# Patient Record
Sex: Female | Born: 1975 | Race: White | Hispanic: No | Marital: Married | Smoking: Never smoker
Health system: Southern US, Community
[De-identification: ages and names within clinical notes are randomized; demographics above are authoritative.]

## PROBLEM LIST (undated history)

## (undated) DIAGNOSIS — L438 Other lichen planus: Secondary | ICD-10-CM

## (undated) DIAGNOSIS — M1990 Unspecified osteoarthritis, unspecified site: Secondary | ICD-10-CM

## (undated) DIAGNOSIS — L439 Lichen planus, unspecified: Secondary | ICD-10-CM

## (undated) DIAGNOSIS — M1711 Unilateral primary osteoarthritis, right knee: Secondary | ICD-10-CM

## (undated) DIAGNOSIS — K649 Unspecified hemorrhoids: Secondary | ICD-10-CM

## (undated) DIAGNOSIS — I10 Essential (primary) hypertension: Secondary | ICD-10-CM

## (undated) HISTORY — PX: OTHER SURGICAL HISTORY: SHX169

## (undated) HISTORY — PX: HAMMER TOE SURGERY: SHX385

## (undated) HISTORY — PX: BUNIONECTOMY: SHX129

## (undated) HISTORY — DX: Unspecified hemorrhoids: K64.9

## (undated) HISTORY — DX: Lichen planus, unspecified: L43.9

## (undated) HISTORY — DX: Unilateral primary osteoarthritis, right knee: M17.11

## (undated) HISTORY — PX: SCHLEROTHERAPY: SHX5440

## (undated) HISTORY — DX: Essential (primary) hypertension: I10

## (undated) HISTORY — DX: Other lichen planus: L43.8

## (undated) HISTORY — PX: MOUTH SURGERY: SHX715

## (undated) HISTORY — DX: Unspecified osteoarthritis, unspecified site: M19.90

## (undated) HISTORY — PX: FRACTURE SURGERY: SHX138

---

## 2004-08-25 ENCOUNTER — Emergency Department (HOSPITAL_COMMUNITY): Admission: EM | Admit: 2004-08-25 | Discharge: 2004-08-25 | Payer: Self-pay | Admitting: Emergency Medicine

## 2004-09-11 ENCOUNTER — Other Ambulatory Visit: Admission: RE | Admit: 2004-09-11 | Discharge: 2004-09-11 | Payer: Self-pay | Admitting: Family Medicine

## 2005-12-17 ENCOUNTER — Other Ambulatory Visit: Admission: RE | Admit: 2005-12-17 | Discharge: 2005-12-17 | Payer: Self-pay | Admitting: Family Medicine

## 2007-02-28 ENCOUNTER — Other Ambulatory Visit: Admission: RE | Admit: 2007-02-28 | Discharge: 2007-02-28 | Payer: Self-pay | Admitting: Family Medicine

## 2008-06-21 ENCOUNTER — Ambulatory Visit: Payer: Self-pay | Admitting: Pulmonary Disease

## 2008-06-21 DIAGNOSIS — G4720 Circadian rhythm sleep disorder, unspecified type: Secondary | ICD-10-CM | POA: Insufficient documentation

## 2008-07-25 ENCOUNTER — Ambulatory Visit (HOSPITAL_BASED_OUTPATIENT_CLINIC_OR_DEPARTMENT_OTHER): Admission: RE | Admit: 2008-07-25 | Discharge: 2008-07-25 | Payer: Self-pay | Admitting: Pulmonary Disease

## 2008-07-25 ENCOUNTER — Encounter: Payer: Self-pay | Admitting: Pulmonary Disease

## 2008-07-27 ENCOUNTER — Ambulatory Visit: Payer: Self-pay | Admitting: Pulmonary Disease

## 2008-12-31 ENCOUNTER — Ambulatory Visit (HOSPITAL_COMMUNITY): Admission: RE | Admit: 2008-12-31 | Discharge: 2008-12-31 | Payer: Self-pay | Admitting: Obstetrics and Gynecology

## 2009-01-31 ENCOUNTER — Ambulatory Visit (HOSPITAL_COMMUNITY): Admission: RE | Admit: 2009-01-31 | Discharge: 2009-01-31 | Payer: Self-pay | Admitting: Obstetrics and Gynecology

## 2009-02-04 ENCOUNTER — Inpatient Hospital Stay (HOSPITAL_COMMUNITY): Admission: AD | Admit: 2009-02-04 | Discharge: 2009-02-12 | Payer: Self-pay | Admitting: Obstetrics and Gynecology

## 2009-02-04 ENCOUNTER — Encounter: Payer: Self-pay | Admitting: Obstetrics and Gynecology

## 2009-02-10 ENCOUNTER — Ambulatory Visit (HOSPITAL_COMMUNITY): Admission: RE | Admit: 2009-02-10 | Discharge: 2009-02-10 | Payer: Self-pay | Admitting: Obstetrics and Gynecology

## 2009-02-28 ENCOUNTER — Ambulatory Visit (HOSPITAL_COMMUNITY): Admission: RE | Admit: 2009-02-28 | Discharge: 2009-02-28 | Payer: Self-pay | Admitting: Obstetrics and Gynecology

## 2009-03-21 ENCOUNTER — Ambulatory Visit (HOSPITAL_COMMUNITY): Admission: RE | Admit: 2009-03-21 | Discharge: 2009-03-21 | Payer: Self-pay | Admitting: Obstetrics and Gynecology

## 2009-04-12 ENCOUNTER — Ambulatory Visit (HOSPITAL_COMMUNITY): Admission: RE | Admit: 2009-04-12 | Discharge: 2009-04-12 | Payer: Self-pay | Admitting: Obstetrics and Gynecology

## 2009-04-30 ENCOUNTER — Inpatient Hospital Stay (HOSPITAL_COMMUNITY): Admission: AD | Admit: 2009-04-30 | Discharge: 2009-05-02 | Payer: Self-pay | Admitting: Obstetrics and Gynecology

## 2009-05-09 ENCOUNTER — Ambulatory Visit: Admission: RE | Admit: 2009-05-09 | Discharge: 2009-05-09 | Payer: Self-pay | Admitting: Obstetrics and Gynecology

## 2010-08-28 ENCOUNTER — Encounter (HOSPITAL_COMMUNITY): Payer: Self-pay | Admitting: Obstetrics and Gynecology

## 2010-11-10 LAB — CBC
HCT: 25.2 % — ABNORMAL LOW (ref 36.0–46.0)
Hemoglobin: 11.1 g/dL — ABNORMAL LOW (ref 12.0–15.0)
MCV: 93.2 fL (ref 78.0–100.0)
RBC: 2.7 MIL/uL — ABNORMAL LOW (ref 3.87–5.11)
RBC: 3.55 MIL/uL — ABNORMAL LOW (ref 3.87–5.11)
RDW: 14.2 % (ref 11.5–15.5)
WBC: 13.2 10*3/uL — ABNORMAL HIGH (ref 4.0–10.5)

## 2010-11-10 LAB — CCBB MATERNAL DONOR DRAW

## 2010-11-10 LAB — RPR: RPR Ser Ql: NONREACTIVE

## 2010-11-12 LAB — CBC
HCT: 30.3 % — ABNORMAL LOW (ref 36.0–46.0)
HCT: 31.1 % — ABNORMAL LOW (ref 36.0–46.0)
Hemoglobin: 10.6 g/dL — ABNORMAL LOW (ref 12.0–15.0)
Hemoglobin: 11 g/dL — ABNORMAL LOW (ref 12.0–15.0)
RBC: 3.19 MIL/uL — ABNORMAL LOW (ref 3.87–5.11)
RBC: 3.31 MIL/uL — ABNORMAL LOW (ref 3.87–5.11)
RDW: 12.5 % (ref 11.5–15.5)
RDW: 12.6 % (ref 11.5–15.5)

## 2010-11-12 LAB — TYPE AND SCREEN
ABO/RH(D): O POS
Antibody Screen: NEGATIVE
Antibody Screen: NEGATIVE
Antibody Screen: NEGATIVE

## 2010-11-12 LAB — KLEIHAUER-BETKE STAIN
# Vials RhIg: 1
Quantitation Fetal Hemoglobin: 10 mL

## 2010-11-12 LAB — ABO/RH: ABO/RH(D): O POS

## 2010-12-19 NOTE — Procedures (Signed)
[name], [name]             ACCOUNT NO.:  [id number]   MEDICAL RECORD NO.:  [id number]          PATIENT TYPE:  OUT   LOCATION:  SLEEP CENTER                 FACILITY:  [facility]   PHYSICIAN:  Oretha Milch, MD      DATE OF BIRTH:  03-06-1976   DATE OF STUDY:  07/25/2008                            NOCTURNAL POLYSOMNOGRAM   REFERRING PHYSICIAN:   INDICATION FOR THE STUDY:  Frequent arousals and nonrefreshing sleep in  this 35 year old woman with a weight of 182 pounds, height of 5 feet 9  inches, and a BMI of 27 with the neck size of 14 inches and Epworth  sleepiness score of 11.   This overnight polysomnogram was performed with a sleep technologist in  attendance.  EEG, EOG, EMG, EKG, and respiratory parameters were  recorded.  Sleep stages, arousals, limb movements, and respiratory data  were scored according to criteria laid out by the American Academy of  Sleep Medicine.   SLEEP ARCHITECTURE:  Lights out was at 10:46 p.m.  Lights on was at 5:02  a.m.  Total sleep time was 313 minutes with a sleep period time of 351.5  minutes and a sleep efficiency of 83.4%.  Sleep latency was 24 minutes  and latency to REM sleep was 125 minutes.  REM sleep was noted in 3  periods during the night with good preparation.  Sleep stages as a  percentage of total sleep time was N1 4.6%, N2 71.2%, N3 12.6%, and REM  sleep 11.5% (36 minutes).  Supine REM was seen for all this time.  She  spent the entire night in the supine position.   AROUSAL DATA:  She had a total of 41 arousals with an arousal index of  7.9 events per hour; of these 22 were spontaneous and 19 were associated  with isolated limb movements.   RESPIRATORY DATA:  There was 0 obstructive apnea, 0 central apnea, 0  mixed apnea, and 1 hypopnea with an AHI of 0.2 events per hour.  No  RERAs were noted.   LIMB MOVEMENT DATA:  Isolated limb movements were seen, some of which  were associated with arousal.  However, periodic limb  movements were not  noted.   OXYGEN SATURATION DATA:  The desaturation index was 0.2 events per hour.  The lowest desaturation was 93%.   CARDIAC DATA:  The low heart rate was 39 beats per minute.  The high  heart rate recorded was an artifact.   DISCUSSION:  Some increase in chin EMG tone was noted.  This was not  felt to be significant.  No bruxism was noted.   IMPRESSION:  1. Overnight polysomnogram with no evidence of obstructive sleep      apnea.  2. No evidence of periodic limb movements, cardiac arrhythmias, or      behavioral disturbance during sleep.  3. No cause for the patient's frequent arousals and nonrefreshing      sleeps were noted during the study.      Oretha Milch, MD  Electronically Signed     RVA/MEDQ  D:  07/27/2008 13:09:48  T:  07/28/2008 03:45:52  Job:  [id number]

## 2010-12-22 NOTE — Discharge Summary (Signed)
[name], [name]             ACCOUNT NO.:  [id number]   MEDICAL RECORD NO.:  [id number]          PATIENT TYPE:  INP   LOCATION:  9155                          FACILITY:  [facility]   PHYSICIAN:  Duke Salvia. Marcelle Overlie, M.D.DATE OF BIRTH:  1975-12-24   DATE OF ADMISSION:  02/03/2009  DATE OF DISCHARGE:  02/12/2009                               DISCHARGE SUMMARY   ADMITTING DIAGNOSES:  1. Intrauterine pregnancy at 70 weeks estimated gestational age.  2. Vaginal bleeding.   DISCHARGE DIAGNOSES:  1. Intrauterine pregnancy at 29-3/7 weeks estimated gestational age.  2. Marginal placental abruption, stable.   REASON FOR ADMISSION:  Please see written H and P.   HOSPITAL COURSE:  The patient is a 35 year old primigravida that was  admitted to [facility] at 28-2/7 weeks estimated  gestational age with complaints of vaginal bleeding.  Vaginal exam had  revealed cervix was closed, thick and presentation was high in the  pelvis.  She was without contractions.  Ultrasound had revealed no  abruption noted.  The patient was now admitted for further monitoring  and evaluation.  On the following morning, the patient was without  complaint.  She had minimal bleeding.  Vital signs were stable.  Fetal  heart tones in the 140s with acceleration.  No contractions were seen.  Kleihauer-Betke was positive.  She was administered betamethasone for  enhancement of lung maturity.  She was typed and crossed and CBC was  ordered for the following morning.  Over the next several days, the  patient was continued to be monitored.  Maternal Fetal Medicine  consultation was made and patient was observed over the next week  without further bleeding or contractions.  Glucose screening was  performed, which was within normal limits.  Decision was made to  discharge the patient after 1 week of observation.  Discharge  instructions reviewed and the patient was later discharged home.   CONDITION ON  DISCHARGE:  Stable.   DIET:  Regular as tolerated.   ACTIVITY:  Bedrest with bathroom privileges.   She was instructed to call for increasing vaginal bleeding, decrease in  fetal movement, vaginal pressure or loss of amniotic fluid.   DISCHARGE MEDICATIONS:  Prenatal vitamins 1 p.o. daily.      Julio Sicks, N.P.      Richard M. Marcelle Overlie, M.D.  Electronically Signed    CC/MEDQ  D:  03/01/2009  T:  03/01/2009  Job:  [id number]

## 2011-02-05 IMAGING — US US OB FOLLOW-UP
1 series · 18 of 28 positions shown · non-contrast
Comparison: none

OBSTETRICAL ULTRASOUND:
 This ultrasound was performed in The [HOSPITAL], and the [facility] OB/GY[facility] report will be stored to [REDACTED] PACS.

[Series 1: us ob follow-up · 29 acquisitions, 18 frames shown]
[im 1/29]
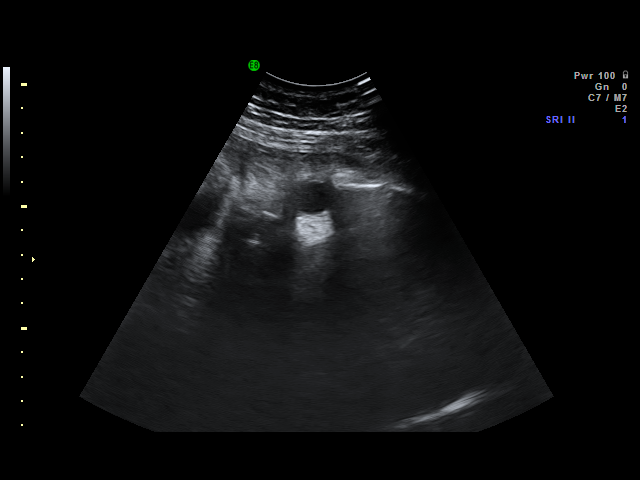
[im 3/29]
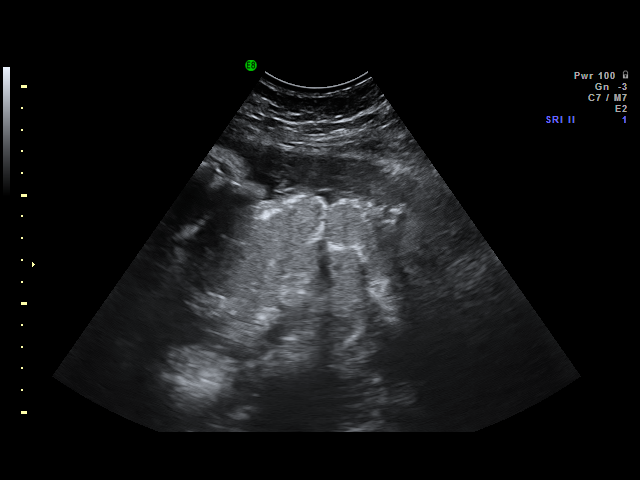
[im 4/29]
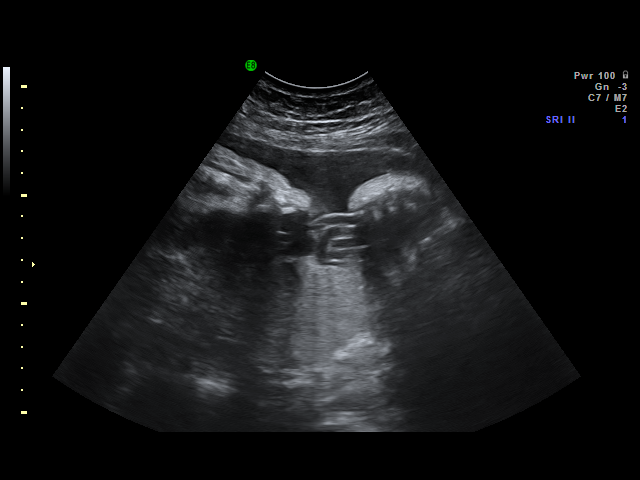
[im 6/29]
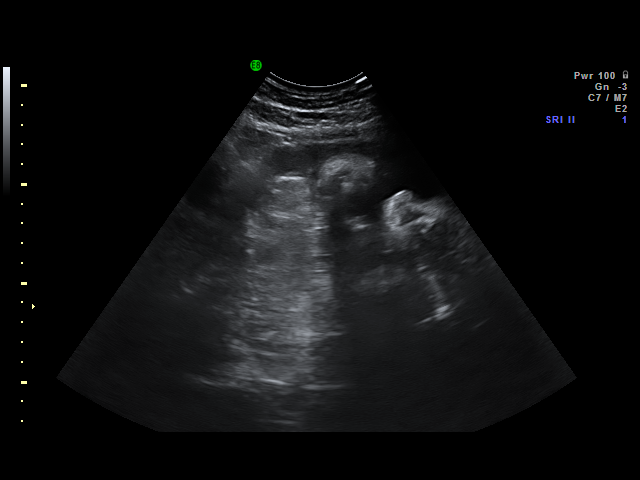
[im 8/29]
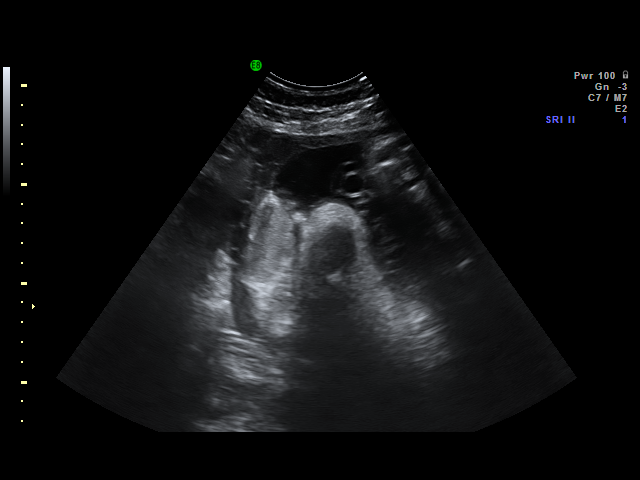
[im 9/29]
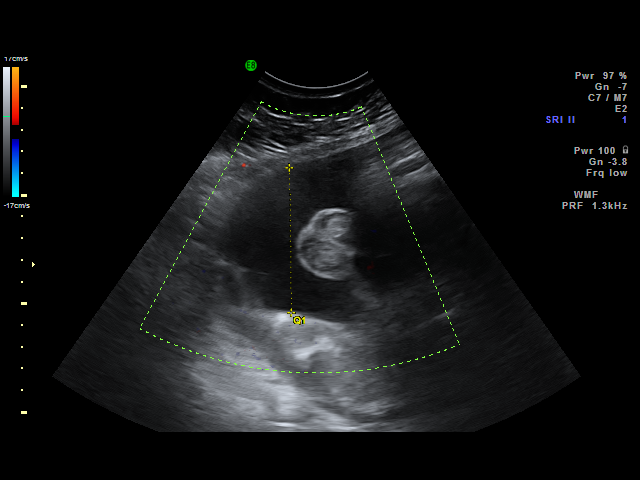
[im 11/29]
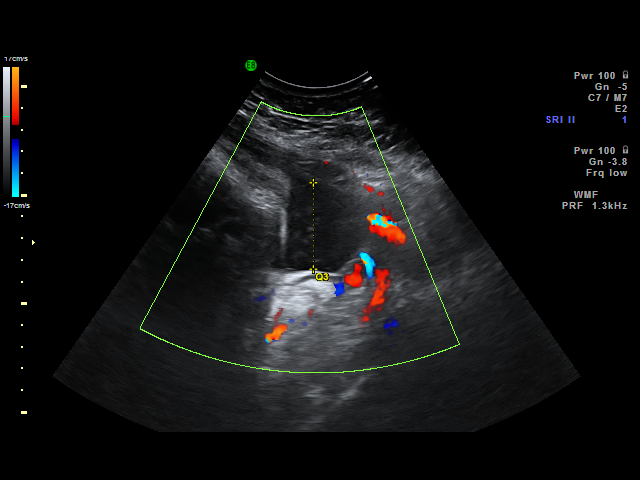
[im 12/29]
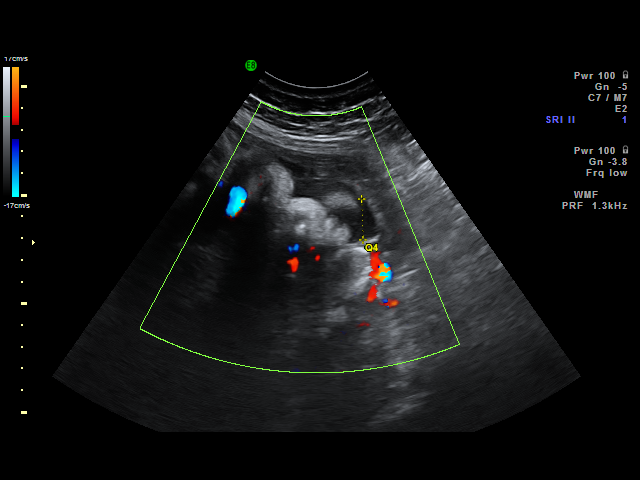
[im 14/29]
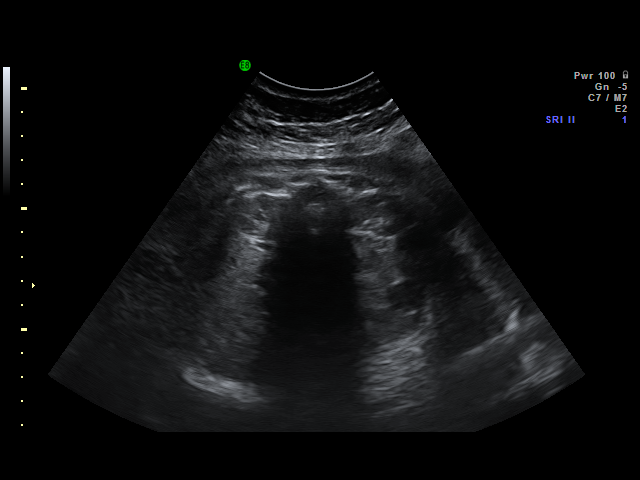
[im 15/29]
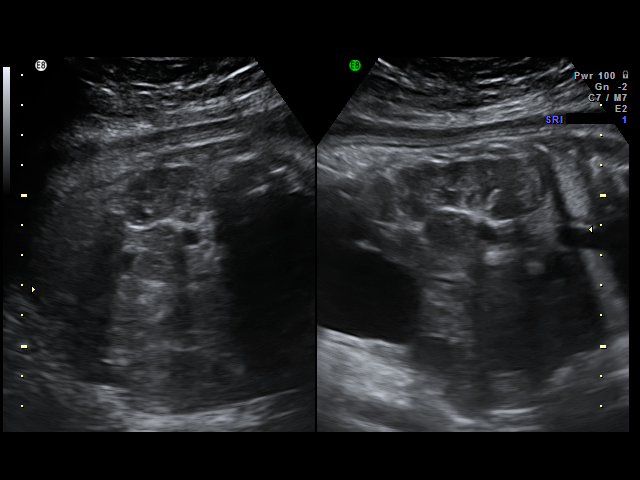
[im 17/29]
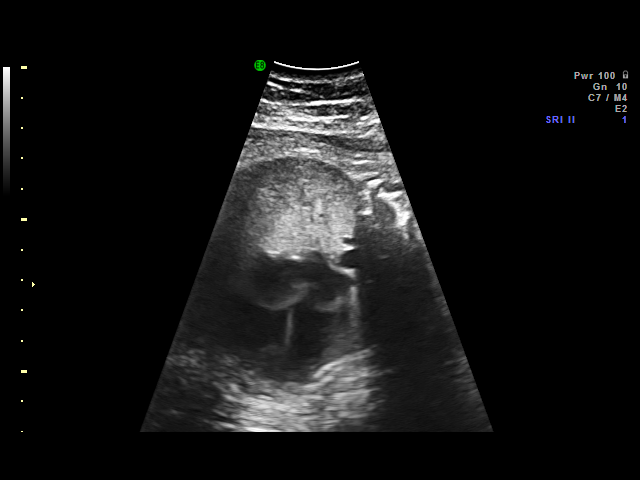
[im 18/29]
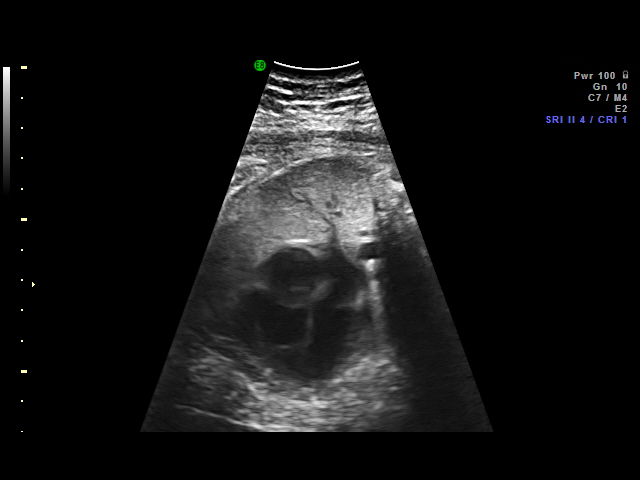
[im 20/29]
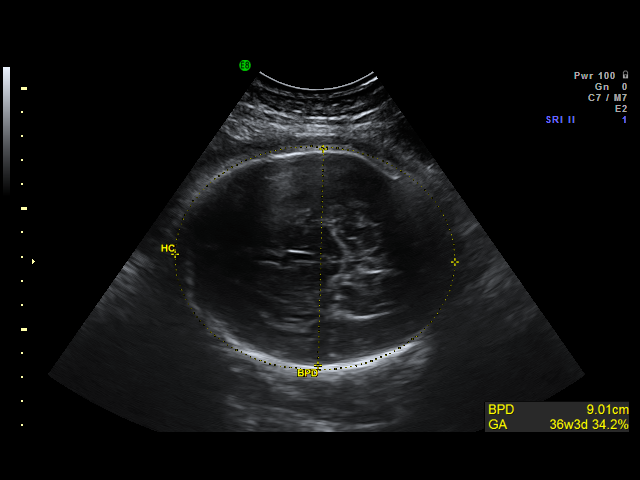
[im 22/29]
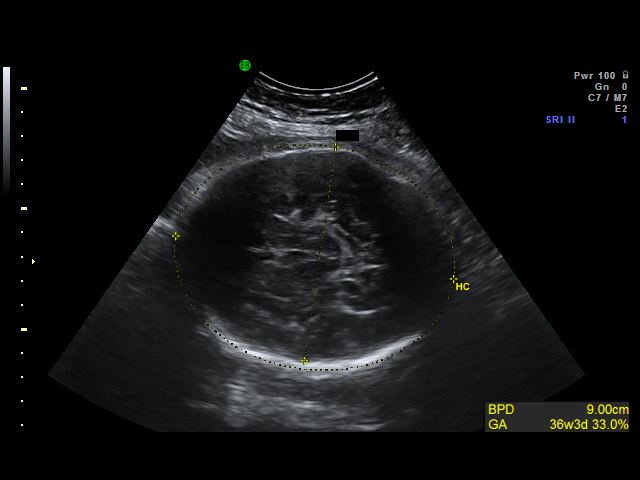
[im 23/29]
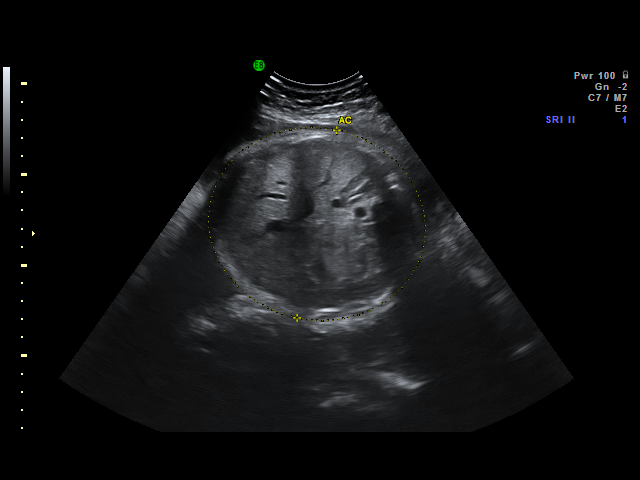
[im 25/29]
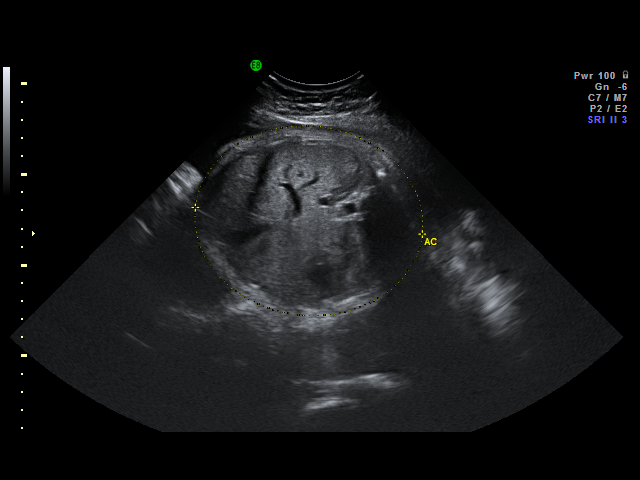
[im 26/29]
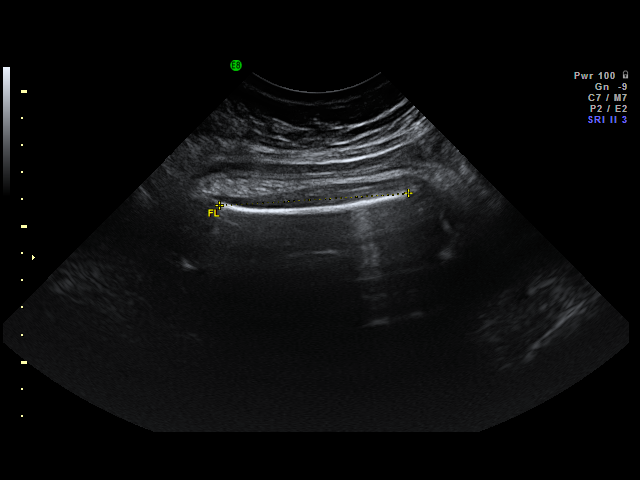
[im 29/29]
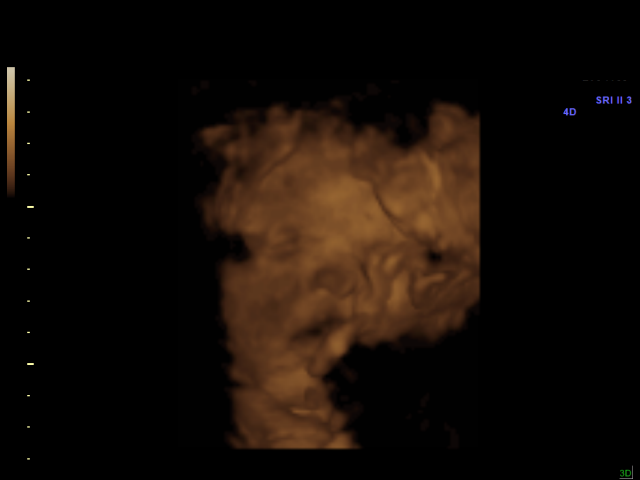

[18 of 28 positions shown; findings below may reference images not displayed]

IMPRESSION: [facility] has also been faxed to the ordering physician.

## 2012-01-18 LAB — OB RESULTS CONSOLE RUBELLA ANTIBODY, IGM: Rubella: IMMUNE

## 2012-01-18 LAB — OB RESULTS CONSOLE RPR: RPR: NONREACTIVE

## 2012-01-18 LAB — OB RESULTS CONSOLE HEPATITIS B SURFACE ANTIGEN: Hepatitis B Surface Ag: NEGATIVE

## 2012-01-18 LAB — OB RESULTS CONSOLE HIV ANTIBODY (ROUTINE TESTING): HIV: NONREACTIVE

## 2012-08-06 NOTE — L&D Delivery Note (Signed)
Delivery Note At 4:50 PM a viable female was delivered via Vaginal, Spontaneous Delivery (Presentation: Right Occiput Anterior).  APGAR: 8, 9; weight .   Placenta status: Intact, Spontaneous.  Cord: 3 vessels with the following complications: None.  Cord pH: not sent  Anesthesia:  epid + loc  Episiotomy: none Lacerations: 2nd deg Suture Repair: 3.0 vicryl rapide Est. Blood Loss (mL): 300  Mom to postpartum.  Baby to nursery-stable.  Zyhir Cappella M 08/24/2012, 5:07 PM

## 2012-08-24 ENCOUNTER — Encounter (HOSPITAL_COMMUNITY): Payer: Self-pay | Admitting: *Deleted

## 2012-08-24 ENCOUNTER — Encounter (HOSPITAL_COMMUNITY): Payer: Self-pay | Admitting: Anesthesiology

## 2012-08-24 ENCOUNTER — Inpatient Hospital Stay (HOSPITAL_COMMUNITY): Payer: 59

## 2012-08-24 ENCOUNTER — Inpatient Hospital Stay (HOSPITAL_COMMUNITY): Payer: 59 | Admitting: Anesthesiology

## 2012-08-24 ENCOUNTER — Inpatient Hospital Stay (HOSPITAL_COMMUNITY)
Admission: AD | Admit: 2012-08-24 | Discharge: 2012-08-26 | DRG: 775 | Disposition: A | Discharge status: Home / Self Care | Payer: 59 | Source: Ambulatory Visit | Attending: Obstetrics and Gynecology | Admitting: Obstetrics and Gynecology

## 2012-08-24 DIAGNOSIS — G4720 Circadian rhythm sleep disorder, unspecified type: Secondary | ICD-10-CM

## 2012-08-24 DIAGNOSIS — O99892 Other specified diseases and conditions complicating childbirth: Secondary | ICD-10-CM | POA: Diagnosis present

## 2012-08-24 DIAGNOSIS — Z22330 Carrier of Group B streptococcus: Secondary | ICD-10-CM

## 2012-08-24 LAB — CBC
HCT: 35.2 % — ABNORMAL LOW (ref 36.0–46.0)
HCT: 31.9 % — ABNORMAL LOW (ref 36.0–46.0)
Hemoglobin: 12.1 g/dL (ref 12.0–15.0)
Hemoglobin: 10.7 g/dL — ABNORMAL LOW (ref 12.0–15.0)
MCHC: 34.4 g/dL (ref 30.0–36.0)
RBC: 3.76 MIL/uL — ABNORMAL LOW (ref 3.87–5.11)
WBC: 15.2 10*3/uL — ABNORMAL HIGH (ref 4.0–10.5)

## 2012-08-24 LAB — RPR: RPR Ser Ql: NONREACTIVE

## 2012-08-24 MED ORDER — EPHEDRINE 5 MG/ML INJ
10.0000 mg | INTRAVENOUS | Status: DC | PRN
  Filled 2012-08-24: qty 4

## 2012-08-24 MED ORDER — ONDANSETRON HCL 4 MG PO TABS: 4.0000 mg | ORAL_TABLET | ORAL | Status: DC | PRN

## 2012-08-24 MED ORDER — OXYTOCIN 40 UNITS IN LACTATED RINGERS INFUSION - SIMPLE MED
62.5000 mL/h | INTRAVENOUS | Status: DC
  Administered 2012-08-24: 62.5 mL/h via INTRAVENOUS
  Filled 2012-08-24: qty 1000

## 2012-08-24 MED ORDER — LACTATED RINGERS IV SOLN
500.0000 mL | INTRAVENOUS | Status: DC | PRN
  Administered 2012-08-24: 500 mL via INTRAVENOUS

## 2012-08-24 MED ORDER — PENICILLIN G POTASSIUM 5000000 UNITS IJ SOLR
5.0000 10*6.[IU] | Freq: Once | INTRAVENOUS | Status: AC
  Administered 2012-08-24: 5 10*6.[IU] via INTRAVENOUS
  Filled 2012-08-24: qty 5

## 2012-08-24 MED ORDER — ZOLPIDEM TARTRATE 5 MG PO TABS: 5.0000 mg | ORAL_TABLET | Freq: Every evening | ORAL | Status: DC | PRN

## 2012-08-24 MED ORDER — BENZOCAINE-MENTHOL 20-0.5 % EX AERO
1.0000 "application " | INHALATION_SPRAY | CUTANEOUS | Status: DC | PRN
  Administered 2012-08-24: 1 via TOPICAL
  Filled 2012-08-24: qty 56

## 2012-08-24 MED ORDER — CITRIC ACID-SODIUM CITRATE 334-500 MG/5ML PO SOLN: 30.0000 mL | ORAL | Status: DC | PRN

## 2012-08-24 MED ORDER — FLEET ENEMA 7-19 GM/118ML RE ENEM: 1.0000 | ENEMA | Freq: Every day | RECTAL | Status: DC | PRN

## 2012-08-24 MED ORDER — LIDOCAINE HCL (PF) 1 % IJ SOLN
INTRAMUSCULAR | Status: DC | PRN
  Administered 2012-08-24 (×2): 9 mL

## 2012-08-24 MED ORDER — EPHEDRINE 5 MG/ML INJ: 10.0000 mg | INTRAVENOUS | Status: DC | PRN

## 2012-08-24 MED ORDER — SENNOSIDES-DOCUSATE SODIUM 8.6-50 MG PO TABS
2.0000 | ORAL_TABLET | Freq: Every day | ORAL | Status: DC
  Administered 2012-08-24 – 2012-08-26 (×2): 2 via ORAL

## 2012-08-24 MED ORDER — PENICILLIN G POTASSIUM 5000000 UNITS IJ SOLR
2.5000 10*6.[IU] | INTRAVENOUS | Status: DC
  Administered 2012-08-24: 2.5 10*6.[IU] via INTRAVENOUS
  Filled 2012-08-24 (×4): qty 2.5

## 2012-08-24 MED ORDER — PRENATAL MULTIVITAMIN CH
1.0000 | ORAL_TABLET | Freq: Every day | ORAL | Status: DC
  Administered 2012-08-25 – 2012-08-26 (×2): 1 via ORAL
  Filled 2012-08-24 (×2): qty 1

## 2012-08-24 MED ORDER — DIPHENHYDRAMINE HCL 25 MG PO CAPS: 25.0000 mg | ORAL_CAPSULE | Freq: Four times a day (QID) | ORAL | Status: DC | PRN

## 2012-08-24 MED ORDER — MEASLES, MUMPS & RUBELLA VAC ~~LOC~~ INJ
0.5000 mL | INJECTION | Freq: Once | SUBCUTANEOUS | Status: DC
  Filled 2012-08-24: qty 0.5

## 2012-08-24 MED ORDER — OXYCODONE-ACETAMINOPHEN 5-325 MG PO TABS: 1.0000 | ORAL_TABLET | ORAL | Status: DC | PRN

## 2012-08-24 MED ORDER — BISACODYL 10 MG RE SUPP: 10.0000 mg | Freq: Every day | RECTAL | Status: DC | PRN

## 2012-08-24 MED ORDER — ONDANSETRON HCL 4 MG/2ML IJ SOLN: 4.0000 mg | INTRAMUSCULAR | Status: DC | PRN

## 2012-08-24 MED ORDER — LANOLIN HYDROUS EX OINT: TOPICAL_OINTMENT | CUTANEOUS | Status: DC | PRN

## 2012-08-24 MED ORDER — OXYCODONE-ACETAMINOPHEN 5-325 MG PO TABS: 1.0000 | ORAL_TABLET | Freq: Four times a day (QID) | ORAL | Status: DC | PRN

## 2012-08-24 MED ORDER — IBUPROFEN 800 MG PO TABS
800.0000 mg | ORAL_TABLET | Freq: Three times a day (TID) | ORAL | Status: DC | PRN
  Administered 2012-08-24 – 2012-08-26 (×5): 800 mg via ORAL
  Filled 2012-08-24 (×7): qty 1

## 2012-08-24 MED ORDER — PHENYLEPHRINE 40 MCG/ML (10ML) SYRINGE FOR IV PUSH (FOR BLOOD PRESSURE SUPPORT): 80.0000 ug | PREFILLED_SYRINGE | INTRAVENOUS | Status: DC | PRN

## 2012-08-24 MED ORDER — FENTANYL 2.5 MCG/ML BUPIVACAINE 1/10 % EPIDURAL INFUSION (WH - ANES)
INTRAMUSCULAR | Status: DC | PRN
  Administered 2012-08-24: 14 mL/h via EPIDURAL

## 2012-08-24 MED ORDER — IBUPROFEN 600 MG PO TABS: 600.0000 mg | ORAL_TABLET | Freq: Four times a day (QID) | ORAL | Status: DC | PRN

## 2012-08-24 MED ORDER — PHENYLEPHRINE 40 MCG/ML (10ML) SYRINGE FOR IV PUSH (FOR BLOOD PRESSURE SUPPORT)
80.0000 ug | PREFILLED_SYRINGE | INTRAVENOUS | Status: DC | PRN
  Filled 2012-08-24: qty 5

## 2012-08-24 MED ORDER — TETANUS-DIPHTH-ACELL PERTUSSIS 5-2.5-18.5 LF-MCG/0.5 IM SUSP: 0.5000 mL | Freq: Once | INTRAMUSCULAR | Status: DC

## 2012-08-24 MED ORDER — FENTANYL 2.5 MCG/ML BUPIVACAINE 1/10 % EPIDURAL INFUSION (WH - ANES)
14.0000 mL/h | INTRAMUSCULAR | Status: DC
  Filled 2012-08-24: qty 125

## 2012-08-24 MED ORDER — ONDANSETRON HCL 4 MG/2ML IJ SOLN: 4.0000 mg | Freq: Four times a day (QID) | INTRAMUSCULAR | Status: DC | PRN

## 2012-08-24 MED ORDER — OXYTOCIN BOLUS FROM INFUSION
500.0000 mL | INTRAVENOUS | Status: DC
  Administered 2012-08-24: 500 mL via INTRAVENOUS

## 2012-08-24 MED ORDER — LIDOCAINE HCL (PF) 1 % IJ SOLN
30.0000 mL | INTRAMUSCULAR | Status: DC | PRN
  Administered 2012-08-24: 30 mL via SUBCUTANEOUS
  Filled 2012-08-24: qty 30

## 2012-08-24 MED ORDER — LACTATED RINGERS IV SOLN
INTRAVENOUS | Status: DC
  Administered 2012-08-24 (×2): via INTRAVENOUS

## 2012-08-24 MED ORDER — WITCH HAZEL-GLYCERIN EX PADS: 1.0000 "application " | MEDICATED_PAD | CUTANEOUS | Status: DC | PRN

## 2012-08-24 MED ORDER — DIBUCAINE 1 % RE OINT
1.0000 "application " | TOPICAL_OINTMENT | RECTAL | Status: DC | PRN
  Administered 2012-08-25: 1 via RECTAL
  Filled 2012-08-24: qty 28

## 2012-08-24 MED ORDER — LACTATED RINGERS IV SOLN
500.0000 mL | Freq: Once | INTRAVENOUS | Status: AC
  Administered 2012-08-24: 11:00:00 via INTRAVENOUS

## 2012-08-24 MED ORDER — DIPHENHYDRAMINE HCL 50 MG/ML IJ SOLN: 12.5000 mg | INTRAMUSCULAR | Status: DC | PRN

## 2012-08-24 MED ORDER — FLEET ENEMA 7-19 GM/118ML RE ENEM
1.0000 | ENEMA | Freq: Once | RECTAL | Status: DC
Start: 2012-08-24 — End: 2012-08-24

## 2012-08-24 MED ORDER — ACETAMINOPHEN 325 MG PO TABS: 650.0000 mg | ORAL_TABLET | ORAL | Status: DC | PRN

## 2012-08-24 MED ORDER — SIMETHICONE 80 MG PO CHEW: 80.0000 mg | CHEWABLE_TABLET | ORAL | Status: DC | PRN

## 2012-08-24 NOTE — MAU Note (Signed)
Pt states around 0630 had large gush of blood, prior abruption at 29 weeks with first pregnancy (delivered term). Pt was 2cm in office last week. Denies issues with bleeding/placenta this pregnancy.

## 2012-08-24 NOTE — MAU Note (Signed)
Pt states she started having contractions around 445am, states she noticed moderate amount of vaginal bleeding at approximately 630.

## 2012-08-24 NOTE — MAU Provider Note (Signed)
  History     CSN: [id number]  Arrival date and time: 08/24/12 0705   None     Chief Complaint  Patient presents with  . Contractions  . Vaginal Bleeding   HPI  Pt is a G2P1001 at 39.6 wks IUP here with report of large gush of blood.  Prior abruption at 29 weeks with first pregnancy (delivered term). Pt was 2cm in office last week. Denies issues with bleeding/placenta this pregnancy.  +fetal movement.     History reviewed. No pertinent past medical history.  History reviewed. No pertinent past surgical history.  History reviewed. No pertinent family history.  History  Substance Use Topics  . Smoking status: Never Smoker   . Smokeless tobacco: Not on file  . Alcohol Use: No    Allergies: No Known Allergies  Prescriptions prior to admission  Medication Sig Dispense Refill  . IRON PO Take 2 tablets by mouth daily.      . Prenatal Vit-Fe Fumarate-FA (PRENATAL MULTIVITAMIN) TABS Take 1 tablet by mouth daily.        Review of Systems  Gastrointestinal: Positive for abdominal pain (contractions).  Genitourinary:       Vaginal bleeding  All other systems reviewed and are negative.   Physical Exam   Blood pressure 122/77, pulse 82, temperature 97.2 F (36.2 C), temperature source Oral, resp. rate 16, unknown if currently breastfeeding.  Physical Exam  Constitutional: She is oriented to person, place, and time. She appears well-developed and well-nourished.  HENT:  Head: Normocephalic.  Neck: Normal range of motion. Neck supple.  Cardiovascular: Normal rate, regular rhythm and normal heart sounds.   Respiratory: Effort normal and breath sounds normal.  GI: Soft. There is no tenderness.  Genitourinary: There is bleeding ( large amount of dark red blood coming out of speculum once inserted; not able to visualize cervix) around the vagina.  Neurological: She is alert and oriented to person, place, and time.  Skin: Skin is warm and dry.   Dilation: 2.5 Effacement  (%): 80 Cervical Position: Middle Station: -3 Presentation: Vertex Exam by:: Ginger Morris RN   MAU Course  Procedures [phone] Dr. Marcelle Overlie called > informed of pt HPI, bleeding status, and OB hx > obtain ultrasound to assess placenta  Pt informed about ordering of ultrasound  Report given to S. Chase Picket who assumes care of patient.    [facility] 08/24/2012, 7:42 AM  Assessment and Plan

## 2012-08-24 NOTE — Anesthesia Procedure Notes (Signed)
Epidural Patient location during procedure: OB Start time: 08/24/2012 10:48 AM End time: 08/24/2012 10:53 AM  Staffing Anesthesiologist: Sandrea Hughs  Preanesthetic Checklist Completed: patient identified, site marked, surgical consent, pre-op evaluation, timeout performed, IV checked, risks and benefits discussed and monitors and equipment checked  Epidural Patient position: sitting Prep: site prepped and draped and DuraPrep Patient monitoring: continuous pulse ox and blood pressure Approach: midline Injection technique: LOR air  Needle:  Needle type: Tuohy  Needle gauge: 17 G Needle length: 9 cm and 9 Needle insertion depth: 6 cm Catheter type: closed end flexible Catheter size: 19 Gauge Catheter at skin depth: 11 cm Test dose: negative and Other  Assessment Sensory level: T8 Events: blood not aspirated, injection not painful, no injection resistance, negative IV test and no paresthesia  Additional Notes Reason for block:procedure for pain

## 2012-08-24 NOTE — MAU Note (Signed)
WMuhammad, CNM notified pt in MAU for c/o vag bleeding/moderate amt noted at 0630 this am. Prior abruption at 29 weeks with first pregnancy, however did deliver at term. Will come see pt in MAU.

## 2012-08-24 NOTE — Progress Notes (Signed)
Dr Marcelle Overlie updated on pt's VE, FHR, contraction pattern, and ultrasound report

## 2012-08-24 NOTE — Anesthesia Preprocedure Evaluation (Signed)
Anesthesia Evaluation  Patient identified by MRN, date of birth, ID band Patient awake    Reviewed: Allergy & Precautions, H&P , NPO status , Patient's Chart, lab work & pertinent test results  Airway Mallampati: II TM Distance: >3 FB Neck ROM: full    Dental No notable dental hx.    Pulmonary neg pulmonary ROS,  - rhonchi  Pulmonary exam normal       Cardiovascular negative cardio ROS      Neuro/Psych negative neurological ROS  negative psych ROS   GI/Hepatic negative GI ROS, Neg liver ROS,   Endo/Other  negative endocrine ROS  Renal/GU negative Renal ROS  negative genitourinary   Musculoskeletal negative musculoskeletal ROS (+)   Abdominal Normal abdominal exam  (+)   Peds negative pediatric ROS (+)  Hematology negative hematology ROS (+)   Anesthesia Other Findings   Reproductive/Obstetrics (+) Pregnancy                           Anesthesia Physical Anesthesia Plan  ASA: II  Anesthesia Plan: Epidural   Post-op Pain Management:    Induction:   Airway Management Planned:   Additional Equipment:   Intra-op Plan:   Post-operative Plan:   Informed Consent: I have reviewed the patients History and Physical, chart, labs and discussed the procedure including the risks, benefits and alternatives for the proposed anesthesia with the patient or authorized representative who has indicated his/her understanding and acceptance.     Plan Discussed with:   Anesthesia Plan Comments:         Anesthesia Quick Evaluation

## 2012-08-24 NOTE — H&P (Signed)
Candace [name] is a 37 y.o. female presenting for vag bleeding/?SROM. Maternal Medical History:  Reason for admission: Reason for admission: rupture of membranes and vaginal bleeding.  Contractions: Onset was 3-5 hours ago.   Frequency: irregular.   Perceived severity is mild.    Fetal activity: Perceived fetal activity is normal.   Last perceived fetal movement was within the past hour.    Prenatal complications: Bleeding.     OB History    Grav Para Term Preterm Abortions TAB SAB Ect Mult Living   2 2 2  0 0 0 0 0 0 1     History reviewed. No pertinent past medical history. Past Surgical History  Procedure Date  . No past surgeries    Family History: family history is not on file. Social History:  reports that she has never smoked. She does not have any smokeless tobacco history on file. She reports that she does not drink alcohol or use illicit drugs.   Prenatal Transfer Tool  Maternal Diabetes: No Genetic Screening: Normal Maternal Ultrasounds/Referrals: Normal Fetal Ultrasounds or other Referrals:  None Maternal Substance Abuse:  No Significant Maternal Medications:  None Significant Maternal Lab Results:  None Other Comments:  None  ROS  Dilation: 4.5 Effacement (%): 80 Station: -3 Exam by:: Dr. Marcelle Overlie Blood pressure 128/83, pulse 86, temperature 97.7 F (36.5 C), temperature source Oral, resp. rate 14, unknown if currently breastfeeding. Maternal Exam:  Uterine Assessment: Contraction strength is mild.  Contraction frequency is irregular.   Abdomen: Patient reports no abdominal tenderness. Fundal height is term FH/FHR 148.   Estimated fetal weight is AGA.   Fetal presentation: vertex  Introitus: Normal vulva. Normal vagina.  Ferning test: not done.  Nitrazine test: not done. Amniotic fluid character: bloody.  Pelvis: adequate for delivery.      Physical Exam  Constitutional: She is oriented to person, place, and time. She appears well-developed  and well-nourished.  HENT:  Head: Normocephalic and atraumatic.  Neck: Normal range of motion. Neck supple.  Cardiovascular: Normal rate and regular rhythm.   Respiratory: Effort normal and breath sounds normal.  GI:       Term FH/nontender  Genitourinary:       Mod bleeding, exam after US>>4-5/bloody show, no membr palpable/vtx  Musculoskeletal: Normal range of motion.  Neurological: She is alert and oriented to person, place, and time.    Prenatal labs: ABO, Rh:   Antibody:   Rubella:   RPR:    HBsAg:    HIV:    GBS:     Assessment/Plan: Term IUP>>US w/o evidence for previa nor abrupt/+ GBS, for IV ABX   Candace [name] M 08/24/2012, 10:21 AM

## 2012-08-25 LAB — CBC
Hemoglobin: 9.8 g/dL — ABNORMAL LOW (ref 12.0–15.0)
MCH: 31.7 pg (ref 26.0–34.0)
MCV: 94.5 fL (ref 78.0–100.0)
RBC: 3.09 MIL/uL — ABNORMAL LOW (ref 3.87–5.11)

## 2012-08-25 MED ORDER — ACETAMINOPHEN 500 MG PO TABS
1000.0000 mg | ORAL_TABLET | Freq: Three times a day (TID) | ORAL | Status: DC | PRN
  Administered 2012-08-25: 1000 mg via ORAL
  Filled 2012-08-25: qty 2

## 2012-08-25 NOTE — Anesthesia Postprocedure Evaluation (Signed)
  Anesthesia Post-op Note  Patient: Candace [name]  Procedure(s) Performed: * No procedures listed *  Patient Location: Mother/Baby  Anesthesia Type:Epidural  Level of Consciousness: awake  Airway and Oxygen Therapy: Patient Spontanous Breathing  Post-op Pain: mild  Post-op Assessment: Patient's Cardiovascular Status Stable and Respiratory Function Stable  Post-op Vital Signs: stable  Complications: No apparent anesthesia complications

## 2012-08-25 NOTE — Progress Notes (Signed)
Post Partum Day 1 Subjective: no complaints, up ad lib, voiding, tolerating PO and + flatus  Objective: Blood pressure 105/64, pulse 73, temperature 97.9 F (36.6 C), temperature source Oral, resp. rate 18, height 5\' 9"  (1.753 m), weight 202 lb (91.627 kg), SpO2 96.00%, unknown if currently breastfeeding.  Physical Exam:  General: alert and cooperative Lochia: appropriate Uterine Fundus: firm Incision: perineum intact DVT Evaluation: No evidence of DVT seen on physical exam. No significant calf/ankle edema.   Basename 08/25/12 0520 08/24/12 1852  HGB 9.8* 10.7*  HCT 29.2* 31.9*    Assessment/Plan: Plan for discharge tomorrow   LOS: 1 day   CURTIS,CAROL G 08/25/2012, 8:07 AM

## 2012-08-26 NOTE — Discharge Summary (Signed)
Obstetric Discharge Summary Reason for Admission: onset of labor, rupture of membranes and VB Prenatal Procedures: ultrasound Intrapartum Procedures: spontaneous vaginal delivery Postpartum Procedures: none Complications-Operative and Postpartum: 2 degree perineal laceration Hemoglobin  Date Value Range Status  08/25/2012 9.8* 12.0 - 15.0 g/dL Final     HCT  Date Value Range Status  08/25/2012 29.2* 36.0 - 46.0 % Final    Physical Exam:  General: alert and cooperative Lochia: appropriate Uterine Fundus: firm Incision: perineum intact DVT Evaluation: No evidence of DVT seen on physical exam. Negative Homan's sign. No cords or calf tenderness. No significant calf/ankle edema.  Discharge Diagnoses: Term Pregnancy-delivered  Discharge Information: Date: 08/26/2012 Activity: pelvic rest Diet: routine Medications: PNV and Iron Condition: stable Instructions: refer to practice specific booklet Discharge to: home   Newborn Data: Live born female  Birth Weight: 7 lb 15.9 oz (3626 g) APGAR: 8, 9  Home with mother.  CURTIS,CAROL G 08/26/2012, 8:23 AM

## 2012-08-27 LAB — TYPE AND SCREEN
ABO/RH(D): O POS
Antibody Screen: NEGATIVE
Unit division: 0

## 2012-08-27 NOTE — Progress Notes (Signed)
Post discharge chart review completed.  

## 2012-08-28 ENCOUNTER — Ambulatory Visit (HOSPITAL_COMMUNITY)
Admission: RE | Admit: 2012-08-28 | Discharge: 2012-08-28 | Disposition: A | Discharge status: Home / Self Care | Payer: 59 | Source: Ambulatory Visit | Attending: Obstetrics and Gynecology | Admitting: Obstetrics and Gynecology

## 2012-08-28 ENCOUNTER — Inpatient Hospital Stay (HOSPITAL_COMMUNITY): Admission: RE | Admit: 2012-08-28 | Payer: 59 | Source: Ambulatory Visit

## 2012-08-28 NOTE — Progress Notes (Signed)
Adult Lactation Consultation Outpatient Visit Note  Patient Name: Candace [name] Date of Birth: [birth year]  Baby:  Bernell List DOB: 08/24/12 Birth weight 7 Lbs and 15 oz Gestational Age at Delivery:term Type of Delivery: vaginal  Breastfeeding History: Frequency of Breastfeeding: mother has put baby to breast with each feeding but baby was often sleepy and weight loss was noted at the ped appointment on 7/21. Supplementation was advised was advised by the MD along with continued breastfeeding. Mother  states " Samson Frederic breast fed a couple of times good". They supplemented with 30 ml of formula 8 times in the last 24 hours and the baby weight increased from 7-3 to 7-8 in 24 hours. Length of Feeding: at breast: "15-20 minutes, baby is mostly sleepy" Voids: 2 voids; baby had 2 additional voids at the appointment today Stools: 3-4 dark black   Supplementing / Method: Pumping:  Type of Pump: mothers electric pump did not work and she could not get her hand pump to remove her milk.   Frequency: attempted use yesterday  Volume:  none  Comments: Pt's pump suction was assessed at appointment today and the pump did not have any suction. Pt purchased another DEBP.    Consultation Evaluation: Mother is very tired and worried. Her breast are firm and engorged. Her nipples are healing but nipple trauma was noted. Mother states her nipples feel much better now. She has been using comfort gels and rest.  Initial Feeding Assessment: Pre-feed Weight: 3372 gm Post-feed Weight: 3382 gm Amount Transferred: 10 ml Comments: Assisted mother with latch and depth on the breast. Mother was tense as she was concerned about pain with latch. Baby latches shallow and sucks a few times and becomes sleepy. A #20 Nipple shield applied and baby became more rhythmic with her sucking pattern and some swallows heard. After 12 minutes and much stimulation, baby was weighed. Milk was noted in the shield. Baby transferred 10 ml.  Breast softened slightly.  Additional Feeding Assessment: Pre-feed Weight: 3382 gm Post-feed Weight: 3396 gm Amount Transferred: 14 ml Comments: baby fed better at the breast but remained sleepy. Mother can latch baby independently and nipple soreness decreased.    Total Breast milk Transferred this Visit: 24 ml Total Supplement Given: none  Additional Interventions: Set up the hospital grade double electric pump and mother expressed 45 ml and 30 ml of colostrum from both breast. She obtained relief and increased her confidence. Breast still remained firm in areas but soften significantly. Plan of care given to: work on a deeper latch and massage breast during the feeding to promote milk transfer, use nipple shield only as needed and observe for milk in the shield and swallowing. If Samson Frederic takes a long time to fed or acts sleepy, pump and give expressed milk per bottle. Mother instructed to pump 6 times per day or more if needed for engorgement.  Fed baby frequently with feeding  cues. Recommended follow up appointment with LC next for feeding assessment due to current difficulties. Mother needed to arrange for a transportation assistance and will call and schedule.  Follow-Up Mother to call and schedule an [facility] appointment. States has ped appointment in 1 month.     Christella Hartigan M 08/28/2012, 7:14 PM

## 2012-08-29 ENCOUNTER — Ambulatory Visit (HOSPITAL_COMMUNITY): Payer: 59

## 2012-09-20 ENCOUNTER — Other Ambulatory Visit: Payer: Self-pay

## 2013-06-11 ENCOUNTER — Other Ambulatory Visit: Payer: Self-pay

## 2014-06-07 ENCOUNTER — Encounter (HOSPITAL_COMMUNITY): Payer: Self-pay | Admitting: *Deleted

## 2017-05-16 ENCOUNTER — Encounter: Payer: Self-pay | Admitting: Nurse Practitioner

## 2017-05-29 ENCOUNTER — Ambulatory Visit (INDEPENDENT_AMBULATORY_CARE_PROVIDER_SITE_OTHER): Payer: 59 | Admitting: Nurse Practitioner

## 2017-05-29 ENCOUNTER — Encounter: Payer: Self-pay | Admitting: Nurse Practitioner

## 2017-05-29 VITALS — BP 118/78 | HR 78 | Ht 69.0 in | Wt 175.0 lb

## 2017-05-29 DIAGNOSIS — K625 Hemorrhage of anus and rectum: Secondary | ICD-10-CM

## 2017-05-29 MED ORDER — NA SULFATE-K SULFATE-MG SULF 17.5-3.13-1.6 GM/177ML PO SOLN: ORAL | 0 refills | Status: DC

## 2017-05-29 NOTE — Progress Notes (Addendum)
      HPI:  Candace CornfieldStephanie is a 41 year old female, new to the practice and here for evaluation of rectal bleeding. She is referred by her GYN Dr. Zelphia CairoGretchen Adkins. One year ago Candace CornfieldStephanie began having rectal bleeding with bowel movements. Interestingly, the rectal bleeding generally only occurs on the first and last day of her menstrual cycle. She usually has some associated rectal discomfort with the passage of stool during episodes of bleeding. No abdominal pain. Except for one instance she hasn't had any rectal bleeding in between menstrual cycles. She is not constipated nor does she  have problems with diarrhea. Her weight is stable. Of note father had IBD. Patient has no arthralgias or skin lesions. She is in good health. 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    Past Medical History:  Diagnosis Date  . Arthritis   . Hemorrhoid   . Hypertension     Past Surgical History:  Procedure Laterality Date  . laser leg surgery    . SCHLEROTHERAPY     Family History  Problem Relation Age of Onset  . Ulcerative colitis Father    Social History  Substance Use Topics  . Smoking status: Never Smoker  . Smokeless tobacco: Never Used  . Alcohol use No   Current Outpatient Prescriptions  Medication Sig Dispense Refill  . fluocinonide cream (LIDEX) 0.05 % Apply 1 application topically 2 (two) times daily.    Marland Kitchen. ibuprofen (ADVIL,MOTRIN) 200 MG tablet Take 200 mg by mouth every 6 (six) hours as needed.    . Losartan Potassium (COZAAR PO) Take by mouth.     No current facility-administered medications for this visit.    No Known Allergies   Review of Systems: All systems reviewed and negative except where noted in HPI.    Physical Exam: BP 118/78   Pulse 78   Ht 5\' 9"  (1.753 m)   Wt 175 lb (79.4 kg)   SpO2 99%   BMI 25.84 kg/m  Constitutional:  Well-developed, white female in no acute distress. Psychiatric: Normal mood and affect. Behavior is  normal. EENT: Pupils normal.  Conjunctivae are normal. No scleral icterus. Neck supple.  Cardiovascular: Normal rate, regular rhythm. No edema Pulmonary/chest: Effort normal and breath sounds normal. No wheezing, rales or rhonchi. Abdominal: Soft, nondistended. Nontender. Bowel sounds active throughout. There are no masses palpable. No hepatomegaly. Rectal : no external lesions. No discomfort / masses on DRE Lymphadenopathy: No cervical adenopathy noted. Neurological: Alert and oriented to person place and time. Skin: Skin is warm and dry. No rashes noted.   ASSESSMENT AND PLAN:  1. 41 yo female with rectal bleeding correlating with menstrual cycles. Some perianal discomfort associated with the bleeding. The relationship between bleeding / perianal discomfort and  and menses is interesting. Nothing on DRE to explain bleeding and couldn't appreciate a fissure to explain discomfort. . -recommend colonoscopy for further evaluation of bleeding. The risks and benefits of colonoscopy with possible polypectomy and / or biopsies were discussed and the patient agrees to proceed.   2. 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 Pyrtle, Carie CaddyJay M, MD

## 2017-05-29 NOTE — Patient Instructions (Signed)
If you are age 39 or older, your body mass index should be between 23-30. Your Body mass index is 25.84 kg/m. If this is out of the aforementioned range listed, please consider follow up with your Primary Care Provider.  If you are age 39 or younger, your body mass index should be between 19-25. Your Body mass index is 25.84 kg/m. If this is out of the aformentioned range listed, please consider follow up with your Primary Care Provider.   You have been scheduled for a colonoscopy. Please follow written instructions given to you at your visit today.  Please pick up your prep supplies at the pharmacy within the next 1-3 days. If you use inhalers (even only as needed), please bring them with you on the day of your procedure. Your physician has requested that you go to www.startemmi.com and enter the access code given to you at your visit today. This web site gives a general overview about your procedure. However, you should still follow specific instructions given to you by our office regarding your preparation for the procedure.  We have sent the following medications to your pharmacy for you to pick up at your convenience: Suprep   You may have a light breakfast the morning of prep day (the day before the procedure).   You may choose from one of the following items: eggs and toast OR chicken noodle soup and crackers.   You should have your breakfast completed between 8:00 and 9:00 am the day before your procedure.    After you have had your light breakfast you should start a clear liquid diet only, NO SOLIDS. No additional solid food is allowed. You may continue to have clear liquid up to 3 hours prior to your procedure.   Thank you for choosing me and [facility].   Willette ClusterPaula Guenther, NP

## 2017-06-01 ENCOUNTER — Encounter: Payer: Self-pay | Admitting: Nurse Practitioner

## 2017-07-02 ENCOUNTER — Encounter: Payer: 59 | Admitting: Internal Medicine

## 2017-07-04 ENCOUNTER — Encounter: Payer: Self-pay | Admitting: Internal Medicine

## 2017-07-05 ENCOUNTER — Telehealth: Payer: Self-pay | Admitting: Nurse Practitioner

## 2017-07-05 NOTE — Telephone Encounter (Signed)
Returned the patient's call. Left a voicemail.

## 2017-07-08 NOTE — Telephone Encounter (Signed)
Pt did not return the phone call. 

## 2017-07-10 ENCOUNTER — Encounter: Payer: 59 | Admitting: Internal Medicine

## 2017-07-22 ENCOUNTER — Encounter: Payer: 59 | Admitting: Internal Medicine

## 2018-03-06 ENCOUNTER — Telehealth: Payer: Self-pay | Admitting: Nurse Practitioner

## 2018-03-06 NOTE — Telephone Encounter (Signed)
Patient states she has not had any rectal bleeding since last year which spontaneously resolved. Today she had her bowel movement and saw blood on the bowel movement as well as on the tissue. She does not have any blood on the underwear. Denies constipation or diarrhea. No abdominal pain. No rectal discomfort.  Her appointment here is 03/27/18. Any recommendations until then?

## 2018-03-07 NOTE — Telephone Encounter (Signed)
Left a message to use OTC hemorrhoidal treatment like the Preparation H and follow the package directions. Keep the appointment.

## 2018-03-07 NOTE — Telephone Encounter (Signed)
Please have Gunnar Fusiaula look at this.Thanks

## 2018-03-07 NOTE — Telephone Encounter (Signed)
Pt is calling back about her symptoms

## 2018-03-27 ENCOUNTER — Ambulatory Visit: Payer: 59 | Admitting: Nurse Practitioner

## 2018-04-16 ENCOUNTER — Encounter: Payer: Self-pay | Admitting: Nurse Practitioner

## 2018-04-16 ENCOUNTER — Ambulatory Visit (INDEPENDENT_AMBULATORY_CARE_PROVIDER_SITE_OTHER): Payer: 59 | Admitting: Nurse Practitioner

## 2018-04-16 ENCOUNTER — Encounter

## 2018-04-16 ENCOUNTER — Other Ambulatory Visit (INDEPENDENT_AMBULATORY_CARE_PROVIDER_SITE_OTHER): Payer: 59

## 2018-04-16 VITALS — BP 120/74 | HR 72 | Ht 69.5 in | Wt 180.0 lb

## 2018-04-16 DIAGNOSIS — K625 Hemorrhage of anus and rectum: Secondary | ICD-10-CM | POA: Diagnosis not present

## 2018-04-16 LAB — CBC
HCT: 40.6 % (ref 36.0–46.0)
HEMOGLOBIN: 13.8 g/dL (ref 12.0–15.0)
MCHC: 34 g/dL (ref 30.0–36.0)
MCV: 88.4 fl (ref 78.0–100.0)
Platelets: 202 10*3/uL (ref 150.0–400.0)
RBC: 4.59 Mil/uL (ref 3.87–5.11)
RDW: 12.7 % (ref 11.5–15.5)
WBC: 7.1 10*3/uL (ref 4.0–10.5)

## 2018-04-16 NOTE — Progress Notes (Addendum)
Primary GI:   Erick Blinks, MD  Chief Complaint:    Rectal bleeding  IMPRESSION and PLAN:    #39.  42 year old female with intermittent painless rectal bleeding.  I saw her for this last year,  DRE  was unremarkable. She was scheduled for a colonoscopy but cancelled it. Bleeding now returned.  Historically the rectal bleeding occurred in conjunction with menstrual cycle but in July she had an episode of painless two days after completion of cycle. Stool may have been on the firm side but didn't feel constipated -Patient would like to know etiology of intermittent rectal bleeding and is now interested in moving forward with colonoscopy. The risks and benefits of colonoscopy with possible polypectomy were discussed and the patient agrees to proceed.   #2.  Chronic thrombocytopenia.  Platelets ranging between 112 and 160 in years 2010 in 2014.  Patient says she has had lab work done more recently with her PCP, does not recall a low platelet count but not sure -CBC today     Addendum: Reviewed and agree with initial management. Beverley Fiedler, MD     HPI:     Patient is a 42 year old female who I saw October 2018 for evaluation of rectal bleeding.  Bleeding was generally associated with menses. DRE was unremarkable. Colonoscopy scheduled but patient called back and said she thought it was no longer necessary.  The bleeding did stop but in July she had another episode of painless rectal bleeding with a bowel movement. Stool was slightly firm but she  didn't strain. She had completed her menstrual cycle a few days prior to the bleeding so felt certain blood in toilet was not of vaginal origin.  She is also having irregular menstrual cycles now and will see her GYN about this. Betzy has no other GI complaints such as abdominal pain, nausea or vomiting.Weight is stable.    Review of systems:     No chest pain, no SOB, no fevers, no urinary sx   Past Medical History:  Diagnosis Date  .  Arthritis   . Hemorrhoid   . Hypertension   . Lichen planus, unspecified    mouth and ?vulva    Patient's surgical history, family medical history, social history, medications and allergies were all reviewed in Epic   Creatinine clearance cannot be calculated (No successful lab value found.)  Current Outpatient Medications  Medication Sig Dispense Refill  . BIOTIN 5000 PO Take 1 tablet by mouth daily.    . fluocinonide gel (LIDEX) 0.05 % Apply 1 application topically daily as needed (for gum flares).    Marland Kitchen ibuprofen (ADVIL,MOTRIN) 200 MG tablet Take 200 mg by mouth every 6 (six) hours as needed.    Marland Kitchen losartan (COZAAR) 25 MG tablet Take 1 tablet by mouth daily.  0  . Losartan Potassium (COZAAR PO) Take by mouth.    . Na Sulfate-K Sulfate-Mg Sulf 17.5-3.13-1.6 GM/177ML SOLN Suprep-Use as directed 354 mL 0  . Zinc 50 MG TABS Take 1 tablet by mouth daily.     No current facility-administered medications for this visit.     Physical Exam:     BP 120/74   Pulse 72   Ht 5' 9.5" (1.765 m)   Wt 180 lb (81.6 kg)   BMI 26.20 kg/m   GENERAL:  Pleasant female in NAD PSYCH: : Cooperative, normal affect EENT:  conjunctiva pink, mucous membranes moist, neck supple without masses CARDIAC:  RRR, no murmur heard, no peripheral  edema PULM: Normal respiratory effort, lungs CTA bilaterally, no wheezing ABDOMEN:  Nondistended, soft, nontender. No obvious masses, no hepatomegaly,  normal bowel sounds SKIN:  turgor, no lesions seen Musculoskeletal:  Normal muscle tone, normal strength NEURO: Alert and oriented x 3, no focal neurologic deficits   Willette Cluster , NP 04/16/2018, 10:07 AM

## 2018-04-16 NOTE — Patient Instructions (Signed)
If you are age 40 or older, your body mass index should be between 23-30. Your Body mass index is 26.2 kg/m. If this is out of the aforementioned range listed, please consider follow up with your Primary Care Provider.  If you are age 82 or younger, your body mass index should be between 19-25. Your Body mass index is 26.2 kg/m. If this is out of the aformentioned range listed, please consider follow up with your Primary Care Provider.   You have been scheduled for a colonoscopy. Please follow written instructions given to you at your visit today.  Please pick up your prep supplies at the pharmacy within the next 1-3 days. YOU ALREADY HAVE PREP! If you use inhalers (even only as needed), please bring them with you on the day of your procedure. Your physician has requested that you go to www.startemmi.com and enter the access code given to you at your visit today. This web site gives a general overview about your procedure. However, you should still follow specific instructions given to you by our office regarding your preparation for the procedure. Your provider has requested that you go to the basement level for lab work before leaving today. Press "B" on the elevator. The lab is located at the first door on the left as you exit the elevator. CBC  Thank you for choosing me and [facility].   Willette Cluster, NP

## 2018-04-17 ENCOUNTER — Telehealth: Payer: Self-pay

## 2018-04-17 NOTE — Telephone Encounter (Signed)
She wants to know what were your concerns that made you want to recheck the platelets. She says the labs from 2018 were normal. Wants to know if she should be concerned, monitoring the platelets, so on. She doesn't understand what you were thinking about. She is not challenging the labs, she just wants to understand.

## 2018-04-18 NOTE — Telephone Encounter (Signed)
Candace [name], there were several platelet counts in the computer during different years that were low.  She said other labs at her PCPs office which were more recent were normal.  That is great news but I would like to check it one more time to make sure that we have a trend of normal platelets.  She does not need to worry.  Just checking this out because low platelet counts can mean bone marrow suppression,  spleen problems, etc.  I just want to make sure that hers are normal and that they are staying normal.  Thank you

## 2018-04-18 NOTE — Telephone Encounter (Signed)
No answer. Cannot leave a message because the voicemail is full and cannot accept messages. I have sent her a detail response through My Chart.

## 2018-04-23 ENCOUNTER — Encounter: Payer: Self-pay | Admitting: Internal Medicine

## 2018-04-23 ENCOUNTER — Ambulatory Visit (AMBULATORY_SURGERY_CENTER): Payer: 59 | Admitting: Internal Medicine

## 2018-04-23 VITALS — BP 107/75 | HR 78 | Temp 98.4°F | Resp 14 | Ht 69.5 in | Wt 180.0 lb

## 2018-04-23 DIAGNOSIS — K648 Other hemorrhoids: Secondary | ICD-10-CM

## 2018-04-23 DIAGNOSIS — K5730 Diverticulosis of large intestine without perforation or abscess without bleeding: Secondary | ICD-10-CM | POA: Diagnosis not present

## 2018-04-23 DIAGNOSIS — K625 Hemorrhage of anus and rectum: Secondary | ICD-10-CM

## 2018-04-23 MED ORDER — SODIUM CHLORIDE 0.9 % IV SOLN: 500.0000 mL | Freq: Once | INTRAVENOUS | Status: DC

## 2018-04-23 NOTE — Progress Notes (Signed)
To recovery, report to RN, VSS. 

## 2018-04-23 NOTE — Op Note (Signed)
[facility] Patient Name: Candace [name] Procedure Date: 04/23/2018 11:16 AM MRN: [id number] Endoscopist: Beverley Fiedler , MD Age: 42 Referring MD:  Date of Birth: August 31, 1975 Gender: Female Account #: [id number] Procedure:                Colonoscopy Indications:              Rectal bleeding Medicines:                Monitored Anesthesia Care Procedure:                Pre-Anesthesia Assessment:                           - Prior to the procedure, a History and Physical                            was performed, and patient medications and                            allergies were reviewed. The patient's tolerance of                            previous anesthesia was also reviewed. The risks                            and benefits of the procedure and the sedation                            options and risks were discussed with the patient.                            All questions were answered, and informed consent                            was obtained. Prior Anticoagulants: The patient has                            taken no previous anticoagulant or antiplatelet                            agents. ASA Grade Assessment: II - A patient with                            mild systemic disease. After reviewing the risks                            and benefits, the patient was deemed in                            satisfactory condition to undergo the procedure.                           After obtaining informed consent, the colonoscope  was passed under direct vision. Throughout the                            procedure, the patient's blood pressure, pulse, and                            oxygen saturations were monitored continuously. The                            Colonoscope was introduced through the anus and                            advanced to the terminal ileum. The colonoscopy was                            performed without difficulty. The patient  tolerated                            the procedure well. The quality of the bowel                            preparation was good. The terminal ileum, ileocecal                            valve, appendiceal orifice, and rectum were                            photographed. Scope In: 11:28:12 AM Scope Out: 11:42:42 AM Scope Withdrawal Time: 0 hours 8 minutes 26 seconds  Total Procedure Duration: 0 hours 14 minutes 30 seconds  Findings:                 The digital rectal exam was normal.                           Multiple small and large-mouthed diverticula were                            found in the sigmoid colon, descending colon and                            hepatic flexure.                           Internal hemorrhoids were found during                            retroflexion. The hemorrhoids were small. Complications:            No immediate complications. Estimated Blood Loss:     Estimated blood loss: none. Impression:               - Diverticulosis in the sigmoid colon, in the                            descending colon and at the hepatic flexure.                           -  Small internal hemorrhoids (likely cause of                            recent/intermittent rectal bleeding).                           - No specimens collected. Recommendation:           - Patient has a contact number available for                            emergencies. The signs and symptoms of potential                            delayed complications were discussed with the                            patient. Return to normal activities tomorrow.                            Written discharge instructions were provided to the                            patient.                           - Resume previous diet.                           - Continue present medications.                           - Repeat colonoscopy in 10 years for screening                            purposes. Beverley Fiedler, MD 04/23/2018  11:52:11 AM This report has been signed electronically.

## 2018-04-23 NOTE — Patient Instructions (Signed)
YOU HAD AN ENDOSCOPIC PROCEDURE TODAY AT THE [facility]:   Refer to the procedure report that was given to you for any specific questions about what was found during the examination.  If the procedure report does not answer your questions, please call your gastroenterologist to clarify.  If you requested that your care partner not be given the details of your procedure findings, then the procedure report has been included in a sealed envelope for you to review at your convenience later.  YOU SHOULD EXPECT: Some feelings of bloating in the abdomen. Passage of more gas than usual.  Walking can help get rid of the air that was put into your GI tract during the procedure and reduce the bloating. If you had a lower endoscopy (such as a colonoscopy or flexible sigmoidoscopy) you may notice spotting of blood in your stool or on the toilet paper. If you underwent a bowel prep for your procedure, you may not have a normal bowel movement for a few days.  Please Note:  You might notice some irritation and congestion in your nose or some drainage.  This is from the oxygen used during your procedure.  There is no need for concern and it should clear up in a day or so.  SYMPTOMS TO REPORT IMMEDIATELY:   Following lower endoscopy (colonoscopy or flexible sigmoidoscopy):  Excessive amounts of blood in the stool  Significant tenderness or worsening of abdominal pains  Swelling of the abdomen that is new, acute  Fever of 100F or higher   For urgent or emergent issues, a gastroenterologist can be reached at any hour by calling [phone].   DIET:  We do recommend a small meal at first, but then you may proceed to your regular diet.  Drink plenty of fluids but you should avoid alcoholic beverages for 24 hours.  ACTIVITY:  You should plan to take it easy for the rest of today and you should NOT DRIVE or use heavy machinery until tomorrow (because of the sedation medicines used during the test).     FOLLOW UP: Our staff will call the number listed on your records the next business day following your procedure to check on you and address any questions or concerns that you may have regarding the information given to you following your procedure. If we do not reach you, we will leave a message.  However, if you are feeling well and you are not experiencing any problems, there is no need to return our call.  We will assume that you have returned to your regular daily activities without incident.  If any biopsies were taken you will be contacted by phone or by letter within the next 1-3 weeks.  Please call us at [phone] if you have not heard about the biopsies in 3 weeks.    SIGNATURES/CONFIDENTIALITY: You and/or your care partner have signed paperwork which will be entered into your electronic medical record.  These signatures attest to the fact that that the information above on your After Visit Summary has been reviewed and is understood.  Full responsibility of the confidentiality of this discharge information lies with you and/or your care-partner.  Repeat colonoscopy 10 years-2029  Diverticulosis and hemorrhoid information given.

## 2018-04-24 ENCOUNTER — Telehealth: Payer: Self-pay

## 2018-04-24 NOTE — Telephone Encounter (Signed)
  Follow up Call-  Call back number 04/23/2018  Post procedure Call Back phone  # [phone]  Permission to leave phone message Yes  Some recent data might be hidden     Patient questions:  Do you have a fever, pain , or abdominal swelling? No. Pain Score  0 *  Have you tolerated food without any problems? Yes.    Have you been able to return to your normal activities? Yes.    Do you have any questions about your discharge instructions: Diet   No. Medications  No. Follow up visit  No.  Do you have questions or concerns about your Care? No.  Actions: * If pain score is 4 or above: No action needed, pain <4.

## 2018-05-14 ENCOUNTER — Telehealth (HOSPITAL_COMMUNITY): Payer: Self-pay | Admitting: Surgery

## 2018-05-14 NOTE — Telephone Encounter (Signed)
Received an ABI order from Dr. Consuela Mimes for PVD.  Spoke with Candace [name], she will call us later to schedule this appointment.  The ABI is pre-op for a surgery that hasn't been scheduled yet.   She has my name and phone number to call once she is ready to schedule the ABI. [facility] [employer].

## 2018-05-29 DIAGNOSIS — M2012 Hallux valgus (acquired), left foot: Secondary | ICD-10-CM

## 2018-05-29 DIAGNOSIS — M21612 Bunion of left foot: Secondary | ICD-10-CM

## 2018-05-29 HISTORY — DX: Bunion of left foot: M21.612

## 2018-05-29 HISTORY — DX: Hallux valgus (acquired), left foot: M20.12

## 2018-09-30 ENCOUNTER — Encounter: Payer: Self-pay | Admitting: Physical Therapy

## 2018-09-30 ENCOUNTER — Ambulatory Visit: Payer: 59 | Attending: Orthopedic Surgery | Admitting: Physical Therapy

## 2018-09-30 ENCOUNTER — Other Ambulatory Visit: Payer: Self-pay

## 2018-09-30 DIAGNOSIS — M25572 Pain in left ankle and joints of left foot: Secondary | ICD-10-CM | POA: Insufficient documentation

## 2018-09-30 DIAGNOSIS — R262 Difficulty in walking, not elsewhere classified: Secondary | ICD-10-CM

## 2018-09-30 DIAGNOSIS — M25675 Stiffness of left foot, not elsewhere classified: Secondary | ICD-10-CM

## 2018-09-30 DIAGNOSIS — R600 Localized edema: Secondary | ICD-10-CM | POA: Insufficient documentation

## 2018-09-30 NOTE — Therapy (Signed)
[facility] Outpatient Rehabilitation [facility] [address] [place], [place], [address] Phone: [phone]   Fax:  [phone]  Physical Therapy Evaluation  Patient Details  Name: Candace [name] MRN: [id number] Date of Birth: Mar 07, 1976 Referring Provider (PT): Dr. Blenda Bridegroom   Encounter Date: 09/30/2018  PT End of Session - 09/30/18 0958    Visit Number  1    Number of Visits  16    Date for PT Re-Evaluation  11/25/18    PT Start Time  0846    PT Stop Time  0936    PT Time Calculation (min)  50 min    Activity Tolerance  Patient tolerated treatment well    Behavior During Therapy  [facility] for tasks assessed/performed       Past Medical History:  Diagnosis Date  . Arthritis    knees  . Hemorrhoid   . Hypertension   . 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 arm surgery Left    at age 89 had broken arm , hardware put in and she thinks removed  . laser leg surgery    . MOUTH SURGERY     wisdom teeth  . SCHLEROTHERAPY      There were no vitals filed for this visit.   Subjective Assessment - 09/30/18 0854    Subjective  Pt with chronic foot problems, underwent surgery DOS 07/24/18.  She was NWB for about 1 week and FWB after another week.  She has difficulty walking even short distances, standing long periods, negotiating stairs (descending) . She has high fluctuations in swelling, has difficulty bending toes due to edema.  She has a hard time keeping up with her kids.      Pertinent History  L foot bunion and hammertoe deformity preoperatively    Limitations  Walking    How long can you walk comfortably?  just a few minutes     Patient Stated Goals  Walk normally, no pain     Currently in Pain?  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 Blenda Bridegroom    Onset Date/Surgical Date  07/24/18    Next MD Visit  as needed, follow up     Prior Therapy  No       Precautions   Precautions  None      Balance Screen   Has the patient fallen in the past 6 months  No      Home Environment   Living Environment  Private residence    Additional Comments  lives with spouse and 2 young children , 9 and 7      Prior Function   Level of Independence  Independent    Vocation Requirements  walking , up to 4 hours OT    Leisure  kids, sports with kids, activity       Cognition   Overall Cognitive Status  Within Functional Limits for tasks assessed      Observation/Other Assessments   Focus on Therapeutic Outcomes (FOTO)   58%      Observation/Other Assessments-Edema    Edema  Figure 8      Circumferential Edema   Circumferential - Right  8 inch    above M/L malleolus    Circumferential - Left   8.5 inch       Figure 8 Edema   Figure 8 - Right   19     Figure 8 - Left   19 1/2 inch       Sensation   Light Touch  Impaired by gross assessment    Additional Comments  medial aspect of L foot       AROM   Right Ankle Dorsiflexion  8    Right Ankle Inversion  52    Right Ankle Eversion  20    Left Ankle Dorsiflexion  5    Left Ankle Plantar Flexion  50    Left Ankle Inversion  40    Left Ankle Eversion  15      PROM   Overall PROM Comments  Decreased Gr. toe extension and flexion, Metatarsal 3-5 extension/flexion  limited as well active and passive       Strength   Overall Strength Comments  Rt ankle WFL     Strength Assessment Site  --   L Gr Toe ext. and  flexion about 10 deg each direction    Left Ankle Dorsiflexion  5/5    Left Ankle Plantar Flexion  4/5   supine    Left Ankle Inversion  4-/5    Left Ankle Eversion  4/5        Objective measurements completed on examination: See above findings.       PT Education - 09/30/18 0957    Education Details  PT/POC, HEP, tape, compression     Person(s) Educated  Patient    Methods  Explanation;Demonstration;Handout    Comprehension  Returned demonstration;Verbalized understanding;Verbal cues required         [facility] Adult PT Treatment/Exercise - 09/30/18 0001      Self-Care   Self-Care  RICE;Retrograde Massage;Heat/Ice Application;Other Self-Care Comments    RICE  vaso for home     Other Self-Care Comments   HEP, elevation       Vasopneumatic   Number Minutes Vasopneumatic   15 minutes    Vasopnuematic Location   Ankle    Vasopneumatic Pressure  Low    Vasopneumatic Temperature   34 deg       Manual Therapy   Manual Therapy  Taping    Kinesiotex  Edema      Kinesiotix   Edema  3 fans         PT Long Term Goals - 09/30/18 1319      PT LONG TERM GOAL #1   Title  Pt will be I with HEP for foot and ankle ROM, strength, proprioception    Time  8    Period  Weeks    Status  New    Target Date  11/25/18      PT LONG TERM GOAL #2   Title  Pt will be able to walk in the community  short distances with min occasional pain in ankle.     Time  8    Period  Weeks    Status  New    Target Date  11/25/18      PT LONG TERM GOAL #3   Title  Pt will be able to stand on L ankle for balance work with pain no more than min pain in foot and ankle.     Time  8    Target Date  11/25/18      PT LONG TERM GOAL #4   Title  Pt will be able to walk down stairs reciprocally with 1 rail, pain no more than min most of the time.     Time  8    Period  Weeks    Status  New    Target Date  11/25/18      PT LONG TERM GOAL #5   Title  Pt will report 50% less edema in L ankle/foot in  evening     Time  8    Period  Weeks    Status  New    Target Date  11/25/18      PT LONG TERM GOAL #6   Title  Pt will be able to work 8 hours and pain no more than 4/10.     Time  8    Period  Weeks    Status  New    Target Date  11/25/18             Plan - 09/30/18 1000    Clinical Impression Statement  Pt presents for mod complexity eval of L foot s/p Akin and Chevron osteotomy and release of 3rd, 4th, 5th flexor tendon release) with subsequent edema, weakness and decreased functional mobility.  She is limited in ambulation due to pain. Swelling at night is severe.  She has had to abbreviate her work schedule and is not productive/efficient when she is at work. She has limited active motion of her toes 3-5 and this is concerning.  I feel it is a product of the the edema and inability to access the muscles to fire.     History and Personal Factors relevant to plan of care:  L chronic foot deformity and pain     Clinical Presentation  Evolving    Clinical Presentation due to:  fluctations in edema, plateau of progression of weightbearing and comfort     Clinical Decision Making  Moderate    Rehab Potential  Excellent    PT Frequency  2x / week   1-2 times per week    PT Duration  8 weeks   6-8 weeks    PT Treatment/Interventions  ADLs/Self Care Home Management;Cryotherapy;Balance training;Gait training;Functional mobility training;Therapeutic exercise;Therapeutic activities;Patient/family education;Manual techniques;Passive range of motion;Scar mobilization;Neuromuscular re-education;Taping;Vasopneumatic Device;Stair training    PT Next Visit Plan  check HEP, teach to tape (edema fans) , Vaso, manual to foot/toes    PT Home Exercise Plan  towel scrunches, Eversion/Inversion,  PROM toes    Consulted and Agree with Plan of Care  Patient       Patient will benefit from skilled therapeutic intervention in order to improve the following deficits and impairments:  Abnormal gait,  Difficulty walking, Hypomobility, Impaired sensation, Pain, Impaired flexibility, Increased fascial restricitons, Decreased strength, Decreased range of motion, Increased edema  Visit Diagnosis: Localized edema  Pain in left ankle and joints of left foot  Difficulty in walking, not elsewhere classified  Stiffness of  foot, left     Problem List Patient Active Problem List   Diagnosis Date Noted  . SLEEP STAGES DYSFUNCTION-UNSPECIFIED 06/21/2008    Breeze Angell 09/30/2018, 1:32 PM  [facility] [address] [place], [place], [address] Phone: [phone]   Fax:  [phone]  Name: Candace [name] MRN: [id number] Date of Birth: 1976/01/12   Karie Mainland, PT 09/30/18 1:33 PM Phone: [phone] Fax: [phone]

## 2018-09-30 NOTE — Patient Instructions (Signed)
Step 1  Step 2  Seated Plantar Fascia Stretch reps: 10  sets: 1  hold: 30  daily: 3  weekly: 7 Setup  Begin sitting in a chair with one leg crossed over your other knee. Use one hand to hold your ankle, and the other to hold your toes. Movement  Gently pull your toes backward until you feel a stretch in the bottom of your foot and hold. Tip  Make sure to keep the stretch slow and controlled. Step 1  Step 2  Towel Scrunches reps: 10  sets: 2  hold: 10  daily: 2  weekly: 7 Setup  Begin in a staggered standing position with your forward foot resting on a flat towel, and the knee slightly bent.  Movement  Keep your back knee straight. Use your toes to scrunch up the towel.  Tip  Make sure to keep the rest of your foot in contact with the ground. Step 1  Step 2  Ankle Inversion Eversion Towel Slide reps: 10  sets: 2  daily: 2  weekly: 7 Setup  Begin sitting upright on a chair with one foot resting on a towel. Movement  Slowly turn your foot inward using your heel as a pivot, then slide it outward, and repeat. Tip  Make sure to keep your foot on the floor during the exercise. Step 1  Step 2  Seated Self Great Toe Stretch reps: 10  sets: 1  hold: 30  daily: 2  weekly: 7 Setup  Begin in a sitting position with one leg crossed over the other so you can hold your foot with your hands. Movement  Use one hand to hold your foot in place while using your other hand to bend your big toe backward until you feel a stretch in the bottom of your foot.  Tip  Make sure to perform the stretch in a slow and controlled manner. Step 1  Step 2  Seated Toe Flexion Extension PROM reps: 10  sets: 2  hold: 30  daily: 1  weekly: 7 Setup  Begin by sitting on a chair with one hand holding your heel and the other grasping your toes. Movement  Gently curl and extend your toes with your hand, keeping your heel in place. Tip  Make sure to keep your foot relaxed as you move your toes.

## 2018-10-06 ENCOUNTER — Encounter: Payer: Self-pay | Admitting: Physical Therapy

## 2018-10-06 ENCOUNTER — Ambulatory Visit: Payer: 59 | Attending: Orthopedic Surgery | Admitting: Physical Therapy

## 2018-10-06 DIAGNOSIS — M25572 Pain in left ankle and joints of left foot: Secondary | ICD-10-CM | POA: Diagnosis present

## 2018-10-06 DIAGNOSIS — M25675 Stiffness of left foot, not elsewhere classified: Secondary | ICD-10-CM

## 2018-10-06 DIAGNOSIS — R600 Localized edema: Secondary | ICD-10-CM | POA: Diagnosis not present

## 2018-10-06 DIAGNOSIS — R262 Difficulty in walking, not elsewhere classified: Secondary | ICD-10-CM

## 2018-10-06 NOTE — Patient Instructions (Signed)
Step 1  Step 2  Seated Figure 4 Ankle Inversion with Resistance reps: 20  sets: 2  hold: 30  daily: 1  weekly: 7 Setup  Begin sitting upright with one ankle resting on your opposite thigh and a resistance band around that foot. The band should be anchored under your other foot on the floor. Movement  Bend your foot away from your body, then rotate your foot by lifting your toes up toward the ceiling, pulling against the resistance. Slowly return to the starting position and repeat. Tip  Make sure to keep your back straight during the exercise. Step 1  Step 2  Step 3  Seated Ankle Eversion with Resistance reps: 20  sets: 2  hold: 30  daily: 1  weekly: 7 Setup  Begin sitting in a chair with a resistance band looped around one foot and anchored under your other foot. Movement  Slowly rotate your foot outward, pulling against the resistance band, then return to the starting position and repeat. Tip  Make sure to keep the rest of your leg still during the exercise, and maintain tension in the band.

## 2018-10-06 NOTE — Therapy (Signed)
[facility] Outpatient Rehabilitation [facility] [address] [place], [place], [address] Phone: [phone]   Fax:  [phone]  Physical Therapy Treatment  Patient Details  Name: Candace [name] MRN: [id number] Date of Birth: Jul 20, 1976 Referring Provider (PT): Dr. Blenda Bridegroom   Encounter Date: 10/06/2018  PT End of Session - 10/06/18 1104    Visit Number  2    Number of Visits  16    Date for PT Re-Evaluation  11/25/18    PT Start Time  1100    PT Stop Time  1156    PT Time Calculation (min)  56 min    Activity Tolerance  Patient tolerated treatment well    Behavior During Therapy  [facility] for tasks assessed/performed       Past Medical History:  Diagnosis Date  . Arthritis    knees  . Hemorrhoid   . Hypertension   . 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 arm surgery Left    at age 40 had broken arm , hardware put in and she thinks removed  . laser leg surgery    . MOUTH SURGERY     wisdom teeth  . SCHLEROTHERAPY      There were no vitals filed for this visit.  Subjective Assessment - 10/06/18 1103    Subjective  Pt feels tight.  Swelling comes and goes. Liked the [employer] and the tape.  Was able to go down the stairs better.     Currently in Pain?  No/denies          [facility] Adult PT Treatment/Exercise - 10/06/18 0001      Self-Care   Other Self-Care Comments   taping to L foot, ankle.  3 fans for edema       Vasopneumatic   Number Minutes Vasopneumatic   15 minutes    Vasopnuematic Location   Ankle    Vasopneumatic Pressure  Low    Vasopneumatic Temperature   34 deg       Ankle Exercises: Stretches   Plantar Fascia Stretch  3 reps;30 seconds    Plantar Fascia Stretch Limitations  added light rotation for supination/pronation       Ankle Exercises: Aerobic   Stationary Bike  5 min L2      Ankle Exercises: Seated   Ankle Circles/Pumps  AAROM;Strengthening;Left;10  reps    Ankle Circles/Pumps Limitations  each direction     Towel Crunch  5 reps    Towel Inversion/Eversion  5 reps    Other Seated Ankle Exercises  T band green x 20 each inversion and eversion (figure 4)              PT Education - 10/06/18 1143    Education Details  HEP tips and theraband, how to self tape her foot     Person(s) Educated  Patient    Methods  Explanation;Demonstration;Verbal cues    Comprehension  Verbalized understanding;Returned demonstration          PT Long Term Goals - 09/30/18 1319      PT LONG TERM GOAL #1   Title  Pt will be I with HEP for foot and ankle ROM, strength, proprioception    Time  8    Period  Weeks    Status  New    Target Date  11/25/18      PT LONG TERM GOAL #2   Title  Pt will be able to walk in the community short distances with min occasional pain in ankle.     Time  8    Period  Weeks    Status  New    Target Date  11/25/18      PT LONG TERM GOAL #3   Title  Pt will be able to stand on L ankle for balance work with pain no more than min pain in foot and ankle.     Time  8    Target Date  11/25/18      PT LONG TERM GOAL #4   Title  Pt will be able to walk down stairs reciprocally with 1 rail, pain no more than min most of the time.     Time  8    Period  Weeks    Status  New    Target Date  11/25/18      PT LONG TERM GOAL #5   Title  Pt will report 50% less edema in L ankle/foot in evening     Time  8    Period  Weeks    Status  New    Target Date  11/25/18      PT LONG TERM GOAL #6   Title  Pt will be able to work 8 hours and pain no more than 4/10.     Time  8    Period  Weeks    Status  New    Target Date  11/25/18            Plan - 10/06/18 1309    Clinical Impression Statement  Pt has been doing well, felt positive effect since taping, Vaso and exercise.  She was able to perform HEP without difficulty.  She knows how to tape her foot, leg and she has tape which she purchased. More toe flexion  observed with towel scrunches.     PT Treatment/Interventions  ADLs/Self Care Home Management;Cryotherapy;Balance training;Gait training;Functional mobility training;Therapeutic exercise;Therapeutic activities;Patient/family education;Manual techniques;Passive range of motion;Scar mobilization;Neuromuscular re-education;Taping;Vasopneumatic Device;Stair training    PT Next Visit Plan  vaso, tape.  Begin more closed chain.  manual.     PT Home Exercise Plan  towel scrunches, Eversion/Inversion,  PROM toes, InV/Ev green band     Consulted and Agree with Plan of Care  Patient       Patient will benefit from skilled therapeutic intervention in order to improve the following deficits and impairments:  Abnormal gait, Difficulty walking, Hypomobility, Impaired sensation, Pain, Impaired flexibility, Increased fascial restricitons, Decreased strength, Decreased range of motion, Increased edema  Visit Diagnosis: Localized edema  Pain in left ankle and joints of left foot  Difficulty in walking, not elsewhere classified  Stiffness of foot, left     Problem List Patient Active Problem List   Diagnosis Date Noted  . SLEEP STAGES DYSFUNCTION-UNSPECIFIED 06/21/2008    Candace [name] 10/06/2018, 1:14 PM  [facility] [address] [place], [place], [address] Phone: [phone]   Fax:  [phone]  Name: Candace [name] MRN: [id number] Date of Birth: 1976-01-18  Karie Mainland, PT 10/06/18 1:14 PM Phone: [phone] Fax: [phone]

## 2018-10-10 ENCOUNTER — Ambulatory Visit: Payer: 59 | Admitting: Physical Therapy

## 2018-10-10 ENCOUNTER — Encounter: Payer: Self-pay | Admitting: Physical Therapy

## 2018-10-10 DIAGNOSIS — R262 Difficulty in walking, not elsewhere classified: Secondary | ICD-10-CM

## 2018-10-10 DIAGNOSIS — M25675 Stiffness of left foot, not elsewhere classified: Secondary | ICD-10-CM

## 2018-10-10 DIAGNOSIS — R600 Localized edema: Secondary | ICD-10-CM | POA: Diagnosis not present

## 2018-10-10 DIAGNOSIS — M25572 Pain in left ankle and joints of left foot: Secondary | ICD-10-CM

## 2018-10-10 NOTE — Therapy (Signed)
[facility] Outpatient Rehabilitation [facility] [address] [place], [place], [address] Phone: [phone]   Fax:  [phone]  Physical Therapy Treatment  Patient Details  Name: Candace [name] MRN: [id number] Date of Birth: 1976-02-13 Referring Provider (PT): Dr. Blenda Bridegroom   Encounter Date: 10/10/2018  PT End of Session - 10/10/18 0845    Visit Number  3    Number of Visits  16    Date for PT Re-Evaluation  11/25/18    PT Start Time  0845    PT Stop Time  0938    PT Time Calculation (min)  53 min    Activity Tolerance  Patient tolerated treatment well    Behavior During Therapy  [facility] for tasks assessed/performed       Past Medical History:  Diagnosis Date  . Arthritis    knees  . Hemorrhoid   . Hypertension   . 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 arm surgery Left    at age 8 had broken arm , hardware put in and she thinks removed  . laser leg surgery    . MOUTH SURGERY     wisdom teeth  . SCHLEROTHERAPY      There were no vitals filed for this visit.  Subjective Assessment - 10/10/18 0844    Subjective  I walked alot yesterday and my gait feels off.  Not much pain with walking other than big toe.  Swelling is better.      Currently in Pain?  No/denies         [facility] Adult PT Treatment/Exercise - 10/10/18 0001      Pilates   Pilates Reformer  See note       Vasopneumatic   Number Minutes Vasopneumatic   15 minutes    Vasopnuematic Location   Ankle    Vasopneumatic Pressure  Low    Vasopneumatic Temperature   34 deg       Kinesiotix   Edema  was already taped       Ankle Exercises: Aerobic   Stationary Bike  5 min L2      Ankle Exercises: Standing   SLS  hip flexion and abduction on foam x 20 each     Heel Raises  Both;10 reps    Other Standing Ankle Exercises  weight shift for ankle DF on step     Other Standing Ankle Exercises  SLS static on foam       Ankle Exercises: Stretches   Slant Board Stretch  3 reps;30 seconds        Pilates Reformer used for LE/core strength, postural strength, lumbopelvic disassociation and core control.  Exercises included:  Footwork 2 red 1 blue press out parallel, turn out  Prancing for toe extension, controlled motion, dynamic stretching   Heel raises in parallel and turn out and turn in x 15 each, no pain     PT Long Term Goals - 09/30/18 1319      PT LONG TERM GOAL #1   Title  Pt will be I with HEP for foot and ankle ROM, strength, proprioception    Time  8    Period  Weeks    Status  New    Target Date  11/25/18      PT LONG TERM GOAL #2   Title  Pt will be able to walk in the community short distances with min  occasional pain in ankle.     Time  8    Period  Weeks    Status  New    Target Date  11/25/18      PT LONG TERM GOAL #3   Title  Pt will be able to stand on L ankle for balance work with pain no more than min pain in foot and ankle.     Time  8    Target Date  11/25/18      PT LONG TERM GOAL #4   Title  Pt will be able to walk down stairs reciprocally with 1 rail, pain no more than min most of the time.     Time  8    Period  Weeks    Status  New    Target Date  11/25/18      PT LONG TERM GOAL #5   Title  Pt will report 50% less edema in L ankle/foot in evening     Time  8    Period  Weeks    Status  New    Target Date  11/25/18      PT LONG TERM GOAL #6   Title  Pt will be able to work 8 hours and pain no more than 4/10.     Time  8    Period  Weeks    Status  New    Target Date  11/25/18            Plan - 10/10/18 0931    Clinical Impression Statement  Candace [name] continues to make progress with gait, foot/toe discomfort and tolerance for walking.  She has mild stiffness in the AM and is cautious about developing a limp.  Swelling mostly in big toe at night, less in dorsum of foot.      PT Treatment/Interventions  ADLs/Self Care Home  Management;Cryotherapy;Balance training;Gait training;Functional mobility training;Therapeutic exercise;Therapeutic activities;Patient/family education;Manual techniques;Passive range of motion;Scar mobilization;Neuromuscular re-education;Taping;Vasopneumatic Device;Stair training    PT Next Visit Plan  vaso, tape.  Begin more closed chain.  manual. Reformer for controlled motion.     PT Home Exercise Plan  towel scrunches, Eversion/Inversion,  PROM toes, InV/Ev green band     Consulted and Agree with Plan of Care  Patient       Patient will benefit from skilled therapeutic intervention in order to improve the following deficits and impairments:  Abnormal gait, Difficulty walking, Hypomobility, Impaired sensation, Pain, Impaired flexibility, Increased fascial restricitons, Decreased strength, Decreased range of motion, Increased edema  Visit Diagnosis: Localized edema  Pain in left ankle and joints of left foot  Difficulty in walking, not elsewhere classified  Stiffness of foot, left     Problem List Patient Active Problem List   Diagnosis Date Noted  . SLEEP STAGES DYSFUNCTION-UNSPECIFIED 06/21/2008    PAA,JENNIFER 10/10/2018, 9:33 AM  [facility] [address] [place], [place], [address] Phone: [phone]   Fax:  [phone]  Name: Candace [name] MRN: [id number] Date of Birth: 11/14/1975  Karie Mainland, PT 10/10/18 9:33 AM Phone: [phone] Fax: [phone]

## 2018-10-13 ENCOUNTER — Ambulatory Visit: Payer: 59 | Admitting: Physical Therapy

## 2018-10-13 ENCOUNTER — Encounter: Payer: Self-pay | Admitting: Physical Therapy

## 2018-10-13 DIAGNOSIS — R600 Localized edema: Secondary | ICD-10-CM

## 2018-10-13 DIAGNOSIS — M25675 Stiffness of left foot, not elsewhere classified: Secondary | ICD-10-CM

## 2018-10-13 DIAGNOSIS — R262 Difficulty in walking, not elsewhere classified: Secondary | ICD-10-CM

## 2018-10-13 DIAGNOSIS — M25572 Pain in left ankle and joints of left foot: Secondary | ICD-10-CM

## 2018-10-13 NOTE — Therapy (Signed)
[facility] [address] [place], [place], [address] Phone: [phone]   Fax:  [phone]  Physical Therapy Treatment  Patient Details  Name: Candace [name] MRN: [id number] Date of Birth: 05-Sep-1975 Referring Provider (PT): Dr. Blenda Bridegroom   Encounter Date: 10/13/2018  PT End of Session - 10/13/18 0851    Visit Number  4    Number of Visits  16    Date for PT Re-Evaluation  11/25/18    PT Start Time  0845    PT Stop Time  0945    PT Time Calculation (min)  60 min    Activity Tolerance  Patient tolerated treatment well    Behavior During Therapy  [facility] for tasks assessed/performed       Past Medical History:  Diagnosis Date  . Arthritis    knees  . Hemorrhoid   . Hypertension   . 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 arm surgery Left    at age 25 had broken arm , hardware put in and she thinks removed  . laser leg surgery    . MOUTH SURGERY     wisdom teeth  . SCHLEROTHERAPY      There were no vitals filed for this visit.  Subjective Assessment - 10/13/18 0850    Subjective  I noticed it swelled alot this weekend as I was up on it.  Took a break from the tape.      Currently in Pain?  No/denies         [facility] Adult PT Treatment/Exercise - 10/13/18 0001      Vasopneumatic   Number Minutes Vasopneumatic   15 minutes    Vasopnuematic Location   Ankle    Vasopneumatic Pressure  Low    Vasopneumatic Temperature   34 deg       Manual Therapy   Manual Therapy  Joint mobilization;Soft tissue mobilization;Taping    Joint Mobilization  metatarsals, gentle, proximal distal phalalanx toes    Soft tissue mobilization  retromassage, dorsal and plantar aspect of foot, medial aspect tight annd restricted     Kinesiotex  Edema      Kinesiotix   Edema  2 fans for swelling prior to vaso       Ankle Exercises: Standing   SLS  hip abd, extension  on foam pad    Rocker Board  2 minutes   double leg static and controlled    Warrior I  on foam , mini lunge step upx 10       Ankle Exercises: Aerobic   Stationary Bike  5 min L2      Ankle Exercises: Seated   Towel Crunch  --   10 reps with pilow case    Marble Pickup  3 min , less use of digit 3-5     Heel Raises  Left;20 reps   8 lbs    Toe Raise  20 reps                  PT Long Term Goals - 10/13/18 5366      PT LONG TERM GOAL #1   Title  Pt will be I with HEP for foot and ankle ROM, strength, proprioception    Status  On-going      PT LONG TERM GOAL #2   Title  Pt will be able to walk in the community  short distances with min occasional pain in ankle.     Status  On-going      PT LONG TERM GOAL #3   Title  Pt will be able to stand on L ankle for balance work with pain no more than min pain in foot and ankle.     Status  On-going      PT LONG TERM GOAL #4   Title  Pt will be able to walk down stairs reciprocally with 1 rail, pain no more than min most of the time.     Status  On-going      PT LONG TERM GOAL #5   Title  Pt will report 50% less edema in L ankle/foot in evening       PT LONG TERM GOAL #6   Title  Pt will be able to work 8 hours and pain no more than 4/10.             Plan - 10/13/18 1039    Clinical Impression Statement  Pt improving with function but lacking toe flexion.  able to pick up marbles with toes.  Noticed a regression of her swelling without tape, so we resumed that today.     PT Treatment/Interventions  ADLs/Self Care Home Management;Cryotherapy;Balance training;Gait training;Functional mobility training;Therapeutic exercise;Therapeutic activities;Patient/family education;Manual techniques;Passive range of motion;Scar mobilization;Neuromuscular re-education;Taping;Vasopneumatic Device;Stair training    PT Next Visit Plan  vaso, tape.  Begin more closed chain.  manual. Reformer for controlled motion.     PT Home Exercise  Plan  towel scrunches, Eversion/Inversion,  PROM toes, InV/Ev green band     Consulted and Agree with Plan of Care  Patient       Patient will benefit from skilled therapeutic intervention in order to improve the following deficits and impairments:  Abnormal gait, Difficulty walking, Hypomobility, Impaired sensation, Pain, Impaired flexibility, Increased fascial restricitons, Decreased strength, Decreased range of motion, Increased edema  Visit Diagnosis: Localized edema  Pain in left ankle and joints of left foot  Difficulty in walking, not elsewhere classified  Stiffness of foot, left     Problem List Patient Active Problem List   Diagnosis Date Noted  . SLEEP STAGES DYSFUNCTION-UNSPECIFIED 06/21/2008    PAA,JENNIFER 10/13/2018, 10:47 AM  [facility] [address] [place], [place], [address] Phone: [phone]   Fax:  [phone]  Name: Candace [name] MRN: [id number] Date of Birth: [birth year]  Karie Mainland, PT 10/13/18 10:47 AM Phone: [phone] Fax: [phone]  Karie Mainland, PT 10/13/18 10:47 AM Phone: [phone] Fax: [phone]

## 2018-10-15 ENCOUNTER — Encounter: Payer: Self-pay | Admitting: Physical Therapy

## 2018-10-15 ENCOUNTER — Ambulatory Visit: Payer: 59 | Admitting: Physical Therapy

## 2018-10-15 ENCOUNTER — Other Ambulatory Visit: Payer: Self-pay

## 2018-10-15 DIAGNOSIS — R600 Localized edema: Secondary | ICD-10-CM

## 2018-10-15 DIAGNOSIS — R262 Difficulty in walking, not elsewhere classified: Secondary | ICD-10-CM

## 2018-10-15 DIAGNOSIS — M25572 Pain in left ankle and joints of left foot: Secondary | ICD-10-CM

## 2018-10-15 DIAGNOSIS — M25675 Stiffness of left foot, not elsewhere classified: Secondary | ICD-10-CM

## 2018-10-15 NOTE — Therapy (Signed)
[facility] [address] [place], [place], [address] Phone: [phone]   Fax:  [phone]  Physical Therapy Treatment  Patient Details  Name: Candace [name] MRN: [id number] Date of Birth: Nov 06, 1975 Referring Provider (PT): Dr. Blenda Bridegroom   Encounter Date: 10/15/2018  PT End of Session - 10/15/18 0903    Visit Number  5    Number of Visits  16    Date for PT Re-Evaluation  11/25/18    PT Start Time  0847    PT Stop Time  0945    PT Time Calculation (min)  58 min    Activity Tolerance  Patient tolerated treatment well    Behavior During Therapy  [facility] for tasks assessed/performed       Past Medical History:  Diagnosis Date  . Arthritis    knees  . Hemorrhoid   . Hypertension   . 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 arm surgery Left    at age 35 had broken arm , hardware put in and she thinks removed  . laser leg surgery    . MOUTH SURGERY     wisdom teeth  . SCHLEROTHERAPY      There were no vitals filed for this visit.  Subjective Assessment - 10/15/18 0859    Subjective  Pt liked the rockerboard, feels more stable.  Swelling back down from the weekend.  Complant won't rent the vaso machine, asks about medical supply store.    Currently in Pain?  No/denies         [facility] Adult PT Treatment/Exercise - 10/15/18 0001      Vasopneumatic   Number Minutes Vasopneumatic   15 minutes    Vasopnuematic Location   Ankle    Vasopneumatic Pressure  Low    Vasopneumatic Temperature   34 deg       Kinesiotix   Edema  2 fans for swelling prior to vaso       Ankle Exercises: Aerobic   Stationary Bike  5 min L2          Pilates Reformer used for LE/core strength, postural strength, lumbopelvic disassociation and core control.  Exercises included:  Footwork parallel on forefoot and turnout about 20 times each   Heel raises in both  positions x 20   Prancing for dynamic stretch   Added toe- in heel raises x 10   Single leg press out on toes then heels x 10    2 red 1 blue for all ex above   Standing: Scooter 1 red , x 15-20 reps each side Needed at least 1 UE most of the time.  Pelvic instability observed especially on LLE.       PT Long Term Goals - 10/13/18 7939      PT LONG TERM GOAL #1   Title  Pt will be I with HEP for foot and ankle ROM, strength, proprioception    Status  On-going      PT LONG TERM GOAL #2   Title  Pt will be able to walk in the community short distances with min occasional pain in ankle.     Status  On-going      PT LONG TERM GOAL #3   Title  Pt will be able to stand on L ankle for balance work with pain no more than min pain in foot and ankle.  Status  On-going      PT LONG TERM GOAL #4   Title  Pt will be able to walk down stairs reciprocally with 1 rail, pain no more than min most of the time.     Status  On-going      PT LONG TERM GOAL #5   Title  Pt will report 50% less edema in L ankle/foot in evening       PT LONG TERM GOAL #6   Title  Pt will be able to work 8 hours and pain no more than 4/10.             Plan - 10/15/18 0912    Clinical Impression Statement  Candace [name] continues to improve with pain and function.  Observed increased flexion of toes today.  Used Pilates Reformer for ankle and foot strength, articulation.      PT Treatment/Interventions  ADLs/Self Care Home Management;Cryotherapy;Balance training;Gait training;Functional mobility training;Therapeutic exercise;Therapeutic activities;Patient/family education;Manual techniques;Passive range of motion;Scar mobilization;Neuromuscular re-education;Taping;Vasopneumatic Device;Stair training    PT Next Visit Plan  vaso, tape.  Begin more closed chain.  manual. Reformer for controlled motion.     PT Home Exercise Plan  towel scrunches, Eversion/Inversion,  PROM toes, InV/Ev green band .  SLS and dynamic  standing on towel     Consulted and Agree with Plan of Care  Patient       Patient will benefit from skilled therapeutic intervention in order to improve the following deficits and impairments:  Abnormal gait, Difficulty walking, Hypomobility, Impaired sensation, Pain, Impaired flexibility, Increased fascial restricitons, Decreased strength, Decreased range of motion, Increased edema  Visit Diagnosis: Localized edema  Pain in left ankle and joints of left foot  Difficulty in walking, not elsewhere classified  Stiffness of foot, left     Problem List Patient Active Problem List   Diagnosis Date Noted  . SLEEP STAGES DYSFUNCTION-UNSPECIFIED 06/21/2008    Candace [name] 10/15/2018, 3:22 PM  [facility] [address] [place], [place], [address] Phone: [phone]   Fax:  [phone]  Name: Candace [name] MRN: [id number] Date of Birth: 26-Sep-1975  Karie Mainland, PT 10/15/18 3:22 PM Phone: [phone] Fax: [phone]

## 2018-10-20 ENCOUNTER — Other Ambulatory Visit: Payer: Self-pay

## 2018-10-20 ENCOUNTER — Encounter: Payer: Self-pay | Admitting: Physical Therapy

## 2018-10-20 ENCOUNTER — Ambulatory Visit: Payer: 59 | Admitting: Physical Therapy

## 2018-10-20 DIAGNOSIS — R600 Localized edema: Secondary | ICD-10-CM | POA: Diagnosis not present

## 2018-10-20 DIAGNOSIS — M25572 Pain in left ankle and joints of left foot: Secondary | ICD-10-CM

## 2018-10-20 DIAGNOSIS — M25675 Stiffness of left foot, not elsewhere classified: Secondary | ICD-10-CM

## 2018-10-20 DIAGNOSIS — R262 Difficulty in walking, not elsewhere classified: Secondary | ICD-10-CM

## 2018-10-20 NOTE — Therapy (Addendum)
[place], [place], [address] Phone: [phone]   Fax:  [phone]  Physical Therapy Treatment/Discharge addended  Patient Details  Name: Candace [name] MRN: [id number] Date of Birth: [birth year] Referring Provider (PT): Dr. Donette Larry   Encounter Date: 10/20/2018  PT End of Session - 10/20/18 0952    Visit Number  6    Number of Visits  16    Date for PT Re-Evaluation  11/25/18    PT Start Time  0845    PT Stop Time  0950    PT Time Calculation (min)  65 min    Activity Tolerance  Patient tolerated treatment well    Behavior During Therapy  [facility] for tasks assessed/performed       Past Medical History:  Diagnosis Date  . Arthritis    knees  . Hemorrhoid   . Hypertension   . 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 arm surgery Left    at age 34 had broken arm , hardware put in and she thinks removed  . laser leg surgery    . MOUTH SURGERY     wisdom teeth  . SCHLEROTHERAPY      There were no vitals filed for this visit.  Subjective Assessment - 10/20/18 0854    Subjective  Swelling is variable, played some light tennis. Is considering discharge and purchase of compression for foot at home.     Currently in Pain?  No/denies         [facility] PT Assessment - 10/20/18 0001      Observation/Other Assessments   Focus on Therapeutic Outcomes (FOTO)   40%      Circumferential Edema   Circumferential - Right  8    Circumferential - Left   8.5      Figure 8 Edema   Figure 8 - Right   19     Figure 8 - Left   19 1/2 inch       AROM   Left Ankle Dorsiflexion  8    Left Ankle Plantar Flexion  --   WNL     Strength   Left Ankle Dorsiflexion  5/5    Left Ankle Plantar Flexion  4+/5    Left Ankle Inversion  4+/5    Left Ankle Eversion  4+/5         [facility] Adult PT Treatment/Exercise - 10/20/18 0001       Vasopneumatic   Number Minutes Vasopneumatic   15 minutes    Vasopnuematic Location   Ankle    Vasopneumatic Pressure  Low    Vasopneumatic Temperature   34 deg       Manual Therapy   Manual Therapy  Joint mobilization;Soft tissue mobilization;Taping    Joint Mobilization  metatarsals, gentle, proximal distal phalalanx toes    Soft tissue mobilization  retromassage, dorsal and plantar aspect of foot, medial aspect tight annd restricted     Kinesiotex  Edema      Kinesiotix   Edema  2 fans for swelling prior to vaso       Ankle Exercises: Aerobic   Stationary Bike  5 min L 2      Ankle Exercises: Standing   SLS  on foam pad with hip flex x 20 dynamic     Heel Raises  Both;20 reps   5 lbs    Warrior  I  hip hinging LLE holding 5 lbs     Other Standing Ankle Exercises  squat x 15 parallel     Other Standing Ankle Exercises  SLS with mini squat    holding 5 lbs x 20 LLE                  PT Long Term Goals - 10/20/18 0953      PT LONG TERM GOAL #1   Title  Pt will be I with HEP for foot and ankle ROM, strength, proprioception    Status  On-going      PT LONG TERM GOAL #2   Title  Pt will be able to walk in the community short distances with min occasional pain in ankle.     Status  Partially Met      PT LONG TERM GOAL #3   Title  Pt will be able to stand on L ankle for balance work with pain no more than min pain in foot and ankle.     Status  Achieved      PT LONG TERM GOAL #4   Title  Pt will be able to walk down stairs reciprocally with 1 rail, pain no more than min most of the time.     Status  Partially Met      PT LONG TERM GOAL #5   Title  Pt will report 50% less edema in L ankle/foot in evening     Status  On-going      PT LONG TERM GOAL #6   Title  Pt will be able to work 8 hours and pain no more than 4/10.     Baseline  has to work this weekend    Status  On-going            Plan - 10/20/18 0956    Clinical Impression Statement  Pt  benefitting from PT , continues to has fluctuating levels of edema in LLE depending on her activity level and depending on the day.  Due to need to stay home with her kids who are not in school she would like to be placed on HOLD and may consider purchasing a home ice/compression unit to manage her swelling.  She is gaining strength and mobility in her toes, is motivated to continue exercising.     PT Treatment/Interventions  ADLs/Self Care Home Management;Cryotherapy;Balance training;Gait training;Functional mobility training;Therapeutic exercise;Therapeutic activities;Patient/family education;Manual techniques;Passive range of motion;Scar mobilization;Neuromuscular re-education;Taping;Vasopneumatic Device;Stair training    PT Next Visit Plan  call /follow up, patient on hold     PT Home Exercise Plan  towel scrunches, Eversion/Inversion,  PROM toes, InV/Ev green band .  SLS and dynamic standing on towel     Consulted and Agree with Plan of Care  Patient       Patient will benefit from skilled therapeutic intervention in order to improve the following deficits and impairments:  Abnormal gait, Difficulty walking, Hypomobility, Impaired sensation, Pain, Impaired flexibility, Increased fascial restricitons, Decreased strength, Decreased range of motion, Increased edema  Visit Diagnosis: Localized edema  Pain in left ankle and joints of left foot  Difficulty in walking, not elsewhere classified  Stiffness of foot, left     Problem List Patient Active Problem List   Diagnosis Date Noted  . SLEEP STAGES DYSFUNCTION-UNSPECIFIED 06/21/2008    PAA,JENNIFER 10/20/2018, 10:05 AM  [facility] [address] [place], [place], [address] Phone: [phone]   Fax:  [phone]  Name: Candace [name] MRN: [id number] Date of Birth: Jan 07, 1976  Raeford Razor, PT 10/20/18 10:05 AM Phone: [phone] Fax: [phone]   PHYSICAL THERAPY  DISCHARGE SUMMARY  Visits from Start of Care: 6  Current functional level related to goals / functional outcomes: See above for most recent    Remaining deficits: Was doing well, went on hold and has not returned since   Education / Equipment: HEP, gait  Plan: Patient agrees to discharge.  Patient goals were partially met. Patient is being discharged due to not returning since the last visit.  ?????    Raeford Razor, PT 04/16/19 2:35 PM Phone: [phone] Fax: [phone]

## 2018-10-24 ENCOUNTER — Encounter: Payer: 59 | Admitting: Physical Therapy

## 2018-10-27 ENCOUNTER — Encounter: Payer: 59 | Admitting: Physical Therapy

## 2018-10-31 ENCOUNTER — Ambulatory Visit: Payer: 59 | Admitting: Physical Therapy

## 2018-11-03 ENCOUNTER — Encounter: Payer: 59 | Admitting: Physical Therapy

## 2018-11-07 ENCOUNTER — Encounter: Payer: 59 | Admitting: Physical Therapy

## 2018-11-10 ENCOUNTER — Ambulatory Visit: Payer: 59 | Admitting: Physical Therapy

## 2018-11-12 ENCOUNTER — Ambulatory Visit: Payer: 59 | Admitting: Physical Therapy

## 2018-11-17 ENCOUNTER — Encounter: Payer: 59 | Admitting: Physical Therapy

## 2018-11-21 ENCOUNTER — Encounter: Payer: 59 | Admitting: Physical Therapy

## 2018-12-09 ENCOUNTER — Telehealth: Payer: Self-pay | Admitting: Physical Therapy

## 2018-12-09 NOTE — Telephone Encounter (Signed)
Candace [name] was contacted today regarding transition of  in-person OP Rehab Services to telehealth due to Covid-19. She chose to be discharged as she felt her home program was effective for her.  She was told to call in the future if PT was needed.  Karie Mainland, PT 12/09/18 1:56 PM Phone: [phone] Fax: [phone]

## 2019-05-25 LAB — LIPID PANEL
Cholesterol: 160 (ref 0–200)
HDL: 62 (ref 35–70)
LDL Cholesterol: 83
Triglycerides: 69 (ref 40–160)

## 2019-05-25 LAB — CBC AND DIFFERENTIAL
Hemoglobin: 13.2 (ref 12.0–16.0)
Platelets: 222 (ref 150–399)

## 2019-05-25 LAB — HEPATIC FUNCTION PANEL
ALT: 7 (ref 7–35)
AST: 14 (ref 13–35)

## 2019-05-25 LAB — HEMOGLOBIN A1C: Hemoglobin A1C: 5.2

## 2019-05-25 LAB — IRON,TIBC AND FERRITIN PANEL: Ferritin: 14

## 2019-05-25 LAB — TSH: TSH: 4.77 (ref 0.41–5.90)

## 2019-05-25 LAB — VITAMIN D 25 HYDROXY (VIT D DEFICIENCY, FRACTURES): Vit D, 25-Hydroxy: 27

## 2019-05-25 LAB — BASIC METABOLIC PANEL: Glucose: 108

## 2019-08-13 DIAGNOSIS — I10 Essential (primary) hypertension: Secondary | ICD-10-CM | POA: Insufficient documentation

## 2019-08-13 LAB — CBC AND DIFFERENTIAL: Hemoglobin: 13.5 (ref 12.0–16.0)

## 2019-08-18 LAB — HM PAP SMEAR: HM Pap smear: NORMAL

## 2019-09-16 ENCOUNTER — Ambulatory Visit (INDEPENDENT_AMBULATORY_CARE_PROVIDER_SITE_OTHER): Payer: Managed Care, Other (non HMO) | Admitting: Family Medicine

## 2019-09-16 ENCOUNTER — Encounter: Payer: Self-pay | Admitting: Family Medicine

## 2019-09-16 ENCOUNTER — Other Ambulatory Visit: Payer: Self-pay

## 2019-09-16 VITALS — BP 140/88 | HR 76 | Temp 97.9°F | Ht 69.5 in | Wt 184.8 lb

## 2019-09-16 DIAGNOSIS — L438 Other lichen planus: Secondary | ICD-10-CM | POA: Diagnosis not present

## 2019-09-16 DIAGNOSIS — M1711 Unilateral primary osteoarthritis, right knee: Secondary | ICD-10-CM

## 2019-09-16 DIAGNOSIS — E038 Other specified hypothyroidism: Secondary | ICD-10-CM | POA: Insufficient documentation

## 2019-09-16 DIAGNOSIS — I10 Essential (primary) hypertension: Secondary | ICD-10-CM | POA: Diagnosis not present

## 2019-09-16 DIAGNOSIS — R7301 Impaired fasting glucose: Secondary | ICD-10-CM

## 2019-09-16 DIAGNOSIS — K5790 Diverticulosis of intestine, part unspecified, without perforation or abscess without bleeding: Secondary | ICD-10-CM | POA: Insufficient documentation

## 2019-09-16 DIAGNOSIS — E039 Hypothyroidism, unspecified: Secondary | ICD-10-CM | POA: Diagnosis not present

## 2019-09-16 DIAGNOSIS — E663 Overweight: Secondary | ICD-10-CM | POA: Insufficient documentation

## 2019-09-16 DIAGNOSIS — Z8639 Personal history of other endocrine, nutritional and metabolic disease: Secondary | ICD-10-CM

## 2019-09-16 HISTORY — DX: Diverticulosis of intestine, part unspecified, without perforation or abscess without bleeding: K57.90

## 2019-09-16 HISTORY — DX: Impaired fasting glucose: R73.01

## 2019-09-16 NOTE — Progress Notes (Signed)
Subjective  CC:  Chief Complaint  Patient presents with  . New Patient (Initial Visit)    previous pcp was Dr. Gweneth Dimitri  . Hypertension    was on bp meds. weaned off of it. High readings this past year. Experiences lightheadness once every couple weeks    HPI: Candace [name] is a 44 y.o. female who presents to [facility] [place] today to establish care with me as a new patient.  Reviewed records from [facility] and labs from work. See media section.  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 Transferring care due to multiple concerns she feels were not necessarily adequately addressed. Sees Dr. Renaldo Fiddler for GYN care and had recent cpe with pap. Will get records.   H/o HTN: reports borderline readings over the years with occ very high reading. Has "felt" when her pressure is up. Will feel pressure in head or "swimmy headed": pressure will be in low 140s/low 90s. Mostly however, she reports very normal readings avg 115-120/70s. She had a panic like attack back in July and [place] walk in clinic evaluated. The work up was negative although she had an elevated bp and HR initially. She has strong FH of htn and HLD with some diabetes, late onset CAD and Stroke in grandparent. She was on losartan 25 in the past but was taken off for low readings. Nl renal function. No cp or palpitations. Lipid values are excellent although her work screened with myeloperoxidase enzyme and it was elevated. This concerns her. Her diet is fair but could be improved and she would like to lose weight.  Fasting glucose was 108. With a1c of 5.2  subclinical hypothyroidism: gyn recommended starting low dose synthroid. No sxs of low thyroid although feels "tired" often.   Internal hemorrhoids and divertic on colonoscopy 2019 done for intermittent painless rectal bleeding. Otherwise  normal colon.   Dx of oral lichens planus.   Mild oa left knee.   H/o iron deficiency with low normal ferritin and nl cbc now. She worries about the ferritin level. She has regular cycles.   Assessment  1. Essential hypertension   2. Diverticulosis   3. Oral lichen planus   4. Subclinical hypothyroidism   5. History of iron deficiency   6. Primary localized osteoarthritis of right knee   7. Overweight (BMI 25.0-29.9)   8. IFG (impaired fasting glucose)      Plan   HTN vs preHTN; counseling done. For now, will monitor. Discussed how taking bp when feeling badly can be misleading, ie during a headache. Most readings are normal. High normal here today in the office. Will monitor, rec healthy low sodium diet and weight loss. Recheck in 6 months.   IFG: rec improving diet. Avoid processed foods, sugars and increase exercise.   Subclinical hypothyroidism: educated. Discussed increased risk of developing low thyroid but no clear evidence that treating at her current levels is beneficial. Will hold off for now and monitor.   Low ferritin as a marker of low iron stores due to menses. rec otc daily supplement  Asymptomatic diverticulosis  Oral lichen planus stable.  Follow up:  6 months for cpe and f/u of problems noted above.  No orders of the defined types were placed in this encounter.  No orders of the defined types were placed in this encounter.    Depression  screen PHQ 2/9 09/16/2019  Decreased Interest 0  Down, Depressed, Hopeless 0  PHQ - 2 Score 0    We updated and reviewed the patient's past history in detail and it is documented below.  Patient Active Problem List   Diagnosis Date Noted  . Essential hypertension 09/16/2019    Priority: High  . IFG (impaired fasting glucose) 09/16/2019    Priority: High    Glucose 108 July 2020, a1c 5.2   . Oral lichen planus 09/16/2019    Priority: Medium  . Subclinical hypothyroidism 09/16/2019    Priority: Medium  .  Overweight (BMI 25.0-29.9) 09/16/2019    Priority: Medium  . Primary localized osteoarthritis of right knee     Priority: Medium  . Diverticulosis 09/16/2019    Priority: Low    Colonoscopy 2019,wide mouth.  Internal hemorrhoids     Health Maintenance  Topic Date Due  . PAP SMEAR-Modifier  08/12/2022  . TETANUS/TDAP  01/17/2027  . INFLUENZA VACCINE  Completed  . HIV Screening  Completed   Immunization History  Administered Date(s) Administered  . Influenza-Unspecified 05/27/2019  . Tdap 01/16/2017   Current Meds  Medication Sig  . Multiple Vitamin (MULTIVITAMIN ADULT PO) Take by mouth.    Allergies: Patient is allergic to lisinopril. Past Medical History Patient  has a past medical history of Bunion, left foot (05/29/2018), Diverticulosis (09/16/2019), Hallux valgus (acquired), left foot (05/29/2018), Hypertension, IFG (impaired fasting glucose) (11/28/9561), Oral lichen planus, and Primary localized osteoarthritis of right knee. Past Surgical History Patient  has a past surgical history that includes Schlerotherapy; Mouth surgery; Bunionectomy; Hammer toe surgery; and Fracture surgery. Family History: Patient family history includes ADD / ADHD in her son; Anxiety disorder in her daughter; Arthritis in her maternal grandmother; CAD in her paternal grandmother; Crohn's disease in her father; Depression in her brother; Diabetes in her maternal grandfather, mother, and paternal grandmother; Heart disease in her maternal grandmother; High Cholesterol in her mother; Hyperlipidemia in her maternal grandmother; Hypertension in her maternal grandmother and mother; Kidney disease in her maternal grandfather and mother; Mental illness in her brother; Stroke in her paternal grandfather. Social History:  Patient  reports that she has never smoked. She has never used smokeless tobacco. She reports that she does not drink alcohol or use drugs.  Review of Systems: Constitutional: negative for  fever or malaise Ophthalmic: negative for photophobia, double vision or loss of vision Cardiovascular: negative for chest pain, dyspnea on exertion, or new LE swelling Respiratory: negative for SOB or persistent cough Gastrointestinal: negative for abdominal pain, change in bowel habits or melena Genitourinary: negative for dysuria or gross hematuria Musculoskeletal: negative for new gait disturbance or muscular weakness Integumentary: negative for new or persistent rashes Neurological: negative for TIA or stroke symptoms Psychiatric: negative for SI or delusions Allergic/Immunologic: negative for hives  Patient Care Team    Relationship Specialty Notifications Start End  Willow Ora, MD PCP - General Family Medicine  09/16/19   Pyrtle, Carie Caddy, MD Consulting Physician Gastroenterology  09/16/19   Zelphia Cairo, MD Consulting Physician Obstetrics and Gynecology  09/16/19   [facility]    09/16/19   Blenda Bridegroom, MD Referring Physician Orthopedic Surgery  09/16/19   Consuela Mimes, MD Consulting Physician Family Medicine  09/16/19    Comment: vein specialist    Objective  Vitals: BP 140/88 (BP Location: Left Arm, Patient Position: Sitting, Cuff Size: Normal)   Pulse 76   Temp 97.9 F (36.6 C) (Temporal)  Ht 5' 9.5" (1.765 m)   Wt 184 lb 12.8 oz (83.8 kg)   LMP 08/10/2019   SpO2 98%   BMI 26.90 kg/m  General:  Well developed, well nourished, no acute distress  Psych:  Alert and oriented,normal mood and affect HEENT:  Normocephalic, atraumatic, non-icteric sclera, PERRL, supple neck without adenopathy, mass or thyromegaly Cardiovascular:  RRR without gallop, rub or murmur, nondisplaced PMI Respiratory:  Good breath sounds bilaterally, CTAB with normal respiratory effort Neurologic:    Mental status is normal. Gross motor and sensory exams are normal. Normal gait   Commons side effects, risks, benefits, and alternatives for medications and treatment  plan prescribed today were discussed, and the patient expressed understanding of the given instructions. Patient is instructed to call or message via MyChart if he/she has any questions or concerns regarding our treatment plan. No barriers to understanding were identified. We discussed Red Flag symptoms and signs in detail. Patient expressed understanding regarding what to do in case of urgent or emergency type symptoms.   Medication list was reconciled, printed and provided to the patient in AVS. Patient instructions and summary information was reviewed with the patient as documented in the AVS. This note was prepared with assistance of Dragon voice recognition software. Occasional wrong-word or sound-a-like substitutions may have occurred due to the inherent limitations of voice recognition software  This visit occurred during the SARS-CoV-2 public health emergency.  Safety protocols were in place, including screening questions prior to the visit, additional usage of staff PPE, and extensive cleaning of exam room while observing appropriate contact time as indicated for disinfecting solutions.

## 2019-09-16 NOTE — Patient Instructions (Addendum)
Please return in 4-6 months for your annual complete physical; please come fasting.  I think you are well! Please work on keeping yourself that way with a healthy diet and some exercise. Get relaxation time and don't over stress. I will keep an eye on the things we talked about for you.   Take an otc iron supplement daily as tolerated and start supplementing your Vit D 1000-2000 units/day. Eat a low sodium diet as well.   It was a pleasure meeting you today! Thank you for choosing [place] to meet your healthcare needs! I truly look forward to working with you. If you have any questions or concerns, please send me a message via Mychart or call the office at [phone].

## 2019-10-09 ENCOUNTER — Encounter: Payer: Self-pay | Admitting: Family Medicine

## 2019-10-16 ENCOUNTER — Ambulatory Visit: Payer: 59 | Attending: Internal Medicine

## 2019-10-16 DIAGNOSIS — Z23 Encounter for immunization: Secondary | ICD-10-CM

## 2019-10-16 NOTE — Progress Notes (Signed)
   Covid-19 Vaccination Clinic  Name:  Candace [name]    MRN: [id number] DOB: Sep 07, 1975  10/16/2019  Candace [name] was observed post Covid-19 immunization for 15 minutes without incident. She was provided with Vaccine Information Sheet and instruction to access the V-Safe system.   Candace [name] was instructed to call 911 with any severe reactions post vaccine: Marland Kitchen Difficulty breathing  . Swelling of face and throat  . A fast heartbeat  . A bad rash all over body  . Dizziness and weakness   Immunizations Administered    Name Date Dose VIS Date Route   Moderna COVID-19 Vaccine 10/16/2019 11:50 AM 0.5 mL 07/07/2019 Intramuscular   Manufacturer: Moderna   Lot: [id number]   NDC: 25189-842-10

## 2019-11-18 ENCOUNTER — Ambulatory Visit: Payer: 59 | Attending: Internal Medicine

## 2019-11-18 DIAGNOSIS — Z23 Encounter for immunization: Secondary | ICD-10-CM

## 2019-11-18 NOTE — Progress Notes (Signed)
   Covid-19 Vaccination Clinic  Name:  Candace [name]    MRN: [id number] DOB: June 25, 1976  11/18/2019  [name] was observed post Covid-19 immunization for 15 minutes without incident. She was provided with Vaccine Information Sheet and instruction to access the V-Safe system.   [name] was instructed to call 911 with any severe reactions post vaccine: Marland Kitchen Difficulty breathing  . Swelling of face and throat  . A fast heartbeat  . A bad rash all over body  . Dizziness and weakness   Immunizations Administered    Name Date Dose VIS Date Route   Moderna COVID-19 Vaccine 11/18/2019  9:44 AM 0.5 mL 07/07/2019 Intramuscular   Manufacturer: Moderna   Lot: [id number]   NDC: 46568-127-51

## 2019-12-17 ENCOUNTER — Ambulatory Visit: Payer: 59 | Admitting: Family Medicine

## 2019-12-17 ENCOUNTER — Other Ambulatory Visit: Payer: Self-pay

## 2019-12-17 ENCOUNTER — Encounter: Payer: Self-pay | Admitting: Family Medicine

## 2019-12-17 VITALS — BP 120/80 | HR 85 | Temp 97.8°F | Ht 69.5 in | Wt 186.0 lb

## 2019-12-17 DIAGNOSIS — R350 Frequency of micturition: Secondary | ICD-10-CM | POA: Diagnosis not present

## 2019-12-17 DIAGNOSIS — N329 Bladder disorder, unspecified: Secondary | ICD-10-CM

## 2019-12-17 LAB — POCT URINALYSIS DIP (MANUAL ENTRY)
Bilirubin, UA: NEGATIVE
Blood, UA: NEGATIVE
Glucose, UA: NEGATIVE mg/dL
Ketones, POC UA: NEGATIVE mg/dL
Leukocytes, UA: NEGATIVE
Nitrite, UA: NEGATIVE
Protein Ur, POC: NEGATIVE mg/dL
Spec Grav, UA: 1.01 (ref 1.010–1.025)
Urobilinogen, UA: 0.2 E.U./dL
pH, UA: 7 (ref 5.0–8.0)

## 2019-12-17 NOTE — Patient Instructions (Signed)
-  UA looks good. Will follow up on culture.  -do not feel like you have any prolapse -if culture is negative i'll talk to dr. Mardelle Matte and see if she wants to image vs. Refer.   So nice to meet you!  Dr. Artis Flock

## 2019-12-17 NOTE — Progress Notes (Signed)
Patient: Candace [name] MRN: [id number] DOB: 11/21/1975 PCP: Willow Ora, MD     Subjective:  Chief Complaint  Patient presents with  . Urinary Urgency  . bladder fullness    HPI: The patient is a 44 y.o. female who presents today for Urinary frequency. She says that she aways feels full, with no feelings of pain or burning. Her symptoms started last Tuesday. She has a heaviness in her bladder. She wakes up feeling normal, but by the evening it feels heavy. She has no burning or pain. She feels like her bladder is about to burst and then when she urinates it's not a lot of urine. She feels like even after she goes it still feels full. No problems getting out the urine. She does have some frequency, but  No urgency. No visible blood in her urine. She has only had one UTI in her life. No hx of kidney stones. No fever/chills, no flank pain. No pain with sex. She is G2P2, vaginal deliveries. Periods are normal. Last pap smear was in January.   Review of Systems  Constitutional: Negative for chills, diaphoresis and fever.  Genitourinary: Positive for frequency. Negative for difficulty urinating, dyspareunia, dysuria, flank pain, hematuria, pelvic pain, urgency and vaginal discharge.    Allergies Patient is allergic to lisinopril.  Past Medical History Patient  has a past medical history of Bunion, left foot (05/29/2018), Diverticulosis (09/16/2019), Hallux valgus (acquired), left foot (05/29/2018), Hypertension, IFG (impaired fasting glucose) (1/69/6789), Oral lichen planus, and Primary localized osteoarthritis of right knee.  Surgical History Patient  has a past surgical history that includes Schlerotherapy; Mouth surgery; Bunionectomy; Hammer toe surgery; and Fracture surgery.  Family History Pateint's family history includes ADD / ADHD in her son; Anxiety disorder in her daughter; Arthritis in her maternal grandmother; CAD in her paternal grandmother; Crohn's disease in her father;  Depression in her brother; Diabetes in her maternal grandfather, mother, and paternal grandmother; Heart disease in her maternal grandmother; High Cholesterol in her mother; Hyperlipidemia in her maternal grandmother; Hypertension in her maternal grandmother and mother; Kidney disease in her maternal grandfather and mother; Mental illness in her brother; Stroke in her paternal grandfather.  Social History Patient  reports that she has never smoked. She has never used smokeless tobacco. She reports that she does not drink alcohol or use drugs.    Objective: Vitals:   12/17/19 1311  BP: 120/80  Pulse: 85  Temp: 97.8 F (36.6 C)  TempSrc: Temporal  SpO2: 99%  Weight: 186 lb (84.4 kg)  Height: 5' 9.5" (1.765 m)    Body mass index is 27.07 kg/m.  Physical Exam Vitals reviewed.  Constitutional:      Appearance: Normal appearance. She is normal weight.  HENT:     Head: Normocephalic and atraumatic.  Cardiovascular:     Rate and Rhythm: Normal rate and regular rhythm.     Heart sounds: Normal heart sounds.  Pulmonary:     Effort: Pulmonary effort is normal.     Breath sounds: Normal breath sounds.  Abdominal:     General: Abdomen is flat. Bowel sounds are normal. There is no distension.     Palpations: Abdomen is soft.     Tenderness: There is no abdominal tenderness. There is no right CVA tenderness or left CVA tenderness.  Genitourinary:    Comments: No prolapse  Skin:    General: Skin is warm.  Neurological:     General: No focal deficit present.  Mental Status: She is alert and oriented to person, place, and time.    UA: wnl.     Assessment/plan: 1. Urinary frequency UA with not a trace of infection. Discussed we will wait for culture. Exam with no prolapse. Discussed other thoughts beside infectious could be neuropathic vs. myofasical vs. Painful bladder syndrome. IF culture negative will see if pcp would like to image vs. Refer. For next step.  - POCT urinalysis  dipstick - Urine Culture  2. Bladder disorder See above.     This visit occurred during the SARS-CoV-2 public health emergency.  Safety protocols were in place, including screening questions prior to the visit, additional usage of staff PPE, and extensive cleaning of exam room while observing appropriate contact time as indicated for disinfecting solutions.     Return if symptoms worsen or fail to improve.   Orma Flaming, MD [facility]   12/17/2019

## 2019-12-18 LAB — URINE CULTURE
MICRO NUMBER:: 10474462
SPECIMEN QUALITY:: ADEQUATE

## 2019-12-21 ENCOUNTER — Telehealth: Payer: Self-pay | Admitting: Family Medicine

## 2019-12-21 ENCOUNTER — Other Ambulatory Visit: Payer: Self-pay | Admitting: Family Medicine

## 2019-12-21 DIAGNOSIS — N329 Bladder disorder, unspecified: Secondary | ICD-10-CM

## 2019-12-21 NOTE — Telephone Encounter (Signed)
Patient is calling in for an update -states that she was told the urine culture was negative from the doctor but received an e-mail from [facility] that states there was contamination and that she would possibly have to re-do the urine test again.

## 2019-12-21 NOTE — Telephone Encounter (Signed)
Patient calling, had lab work and got a message that was not clear. One was from [facility], other from dr Artis Flock. Patient says they don't match and she is confused. Please advise .jk

## 2019-12-22 NOTE — Telephone Encounter (Signed)
Spoke with patient, addressed all concerns

## 2019-12-22 NOTE — Telephone Encounter (Signed)
Attempted to call patient, voice mailbox not set up. 

## 2019-12-29 ENCOUNTER — Telehealth: Payer: Self-pay | Admitting: Family Medicine

## 2019-12-29 NOTE — Telephone Encounter (Signed)
Please call patient: recommend OV in August for f/u on prediabetes, preHTN and thyroid. Will get lab work day of visit.  Keep CPE in October for wellness visit for now . Thanks

## 2019-12-29 NOTE — Telephone Encounter (Signed)
Called patient to reschedule her physical for October and the patient would like to know if it would be possible to come in for an OV for a follow up and still do the fasting labs or if it would be okay to wait until she has a physical.

## 2019-12-30 NOTE — Telephone Encounter (Signed)
Tried contacting patient to get scheduled for an appointment, but vm box was full

## 2020-01-01 ENCOUNTER — Encounter: Payer: Self-pay | Admitting: Family Medicine

## 2020-01-05 ENCOUNTER — Telehealth: Payer: Self-pay | Admitting: Family Medicine

## 2020-01-05 NOTE — Telephone Encounter (Signed)
Patient is returning Katie's call  

## 2020-01-05 NOTE — Telephone Encounter (Signed)
Patient called in this morning to get scheduled for a follow up but states she received a message from Lupita Leash stating that her insurance will not cover due to her insurance so she is asking what blood work [employer] on doing so she can verify with her insurance.

## 2020-01-05 NOTE — Telephone Encounter (Signed)
LMOVM to return call.

## 2020-01-06 NOTE — Telephone Encounter (Signed)
P 

## 2020-01-06 NOTE — Telephone Encounter (Signed)
Spoke with patient, appt made for 03/2020 for 6 month f/u and labs

## 2020-01-28 ENCOUNTER — Encounter: Payer: Managed Care, Other (non HMO) | Admitting: Family Medicine

## 2020-03-16 ENCOUNTER — Ambulatory Visit: Payer: 59 | Admitting: Family Medicine

## 2020-03-16 ENCOUNTER — Encounter: Payer: Self-pay | Admitting: Family Medicine

## 2020-03-16 ENCOUNTER — Other Ambulatory Visit: Payer: Self-pay

## 2020-03-16 VITALS — BP 136/84 | HR 89 | Temp 97.5°F | Resp 16 | Ht 70.0 in | Wt 181.8 lb

## 2020-03-16 DIAGNOSIS — I10 Essential (primary) hypertension: Secondary | ICD-10-CM | POA: Diagnosis not present

## 2020-03-16 DIAGNOSIS — E039 Hypothyroidism, unspecified: Secondary | ICD-10-CM

## 2020-03-16 DIAGNOSIS — R7301 Impaired fasting glucose: Secondary | ICD-10-CM | POA: Diagnosis not present

## 2020-03-16 DIAGNOSIS — E663 Overweight: Secondary | ICD-10-CM

## 2020-03-16 DIAGNOSIS — E038 Other specified hypothyroidism: Secondary | ICD-10-CM

## 2020-03-16 DIAGNOSIS — R790 Abnormal level of blood mineral: Secondary | ICD-10-CM

## 2020-03-16 NOTE — Progress Notes (Signed)
Subjective  CC:  Chief Complaint  Patient presents with  . Prediabetes    family hx diabetes  . Thyroid Problem    TSH levels slowly elevating each year    HPI: Candace [name] is a 44 y.o. female who presents to the office today for follow up of diabetes and problems listed above in the chief complaint.   IFG follow up: very mild. Would like it rechecked. Eating low carb. Struggling to lose weight although weight is down about 5 pounds. No sxs of hyperglycemia.  Subclinical hypothyroidism: History of mild subclinical hypothyroidism.  Was normal back in October.  Would like it rechecked.  No symptoms of low or high thyroid.  History of hypertension: Normal rate and symptoms.  No longer having headaches.  Has not checked outside of the office.  No chest pain, shortness of breath or lower extremity edema.  Overweight: Working on eating healthier and avoiding carbs.  She exercises fairly often, walking.  Lab Results  Component Value Date   TSH 4.77 05/25/2019    Wt Readings from Last 3 Encounters:  03/16/20 181 lb 12.8 oz (82.5 kg)  12/17/19 186 lb (84.4 kg)  09/16/19 184 lb 12.8 oz (83.8 kg)    BP Readings from Last 3 Encounters:  03/16/20 136/84  12/17/19 120/80  09/16/19 140/88    Assessment  1. IFG (impaired fasting glucose)   2. Essential hypertension   3. Subclinical hypothyroidism   4. Overweight (BMI 25.0-29.9)   5. Low ferritin      Plan   IFG: mild. Recheck today.  Discussed healthy diet, weight loss and regular exercise to help with sugar control, blood pressure control and weight management.  Recheck thyroid levels  Reassured, blood pressure is normal today.  Check lab work and cholesterol levels.  If remains normal will change diagnosis to history of hypertension.  History of low ferritin.  Patient is on over-the-counter iron supplement.  Recheck today.  No anemia.  Follow up: No follow-ups on file.. Orders Placed This Encounter  Procedures  .  CBC with Differential/Platelet  . Comprehensive metabolic panel  . Iron, TIBC and Ferritin Panel  . TSH  . T4, free  . T3  . Hemoglobin A1c  . Lipid panel  . POCT HgB A1C   No orders of the defined types were placed in this encounter.     Immunization History  Administered Date(s) Administered  . Influenza-Unspecified 05/27/2019  . Moderna SARS-COVID-2 Vaccination 10/16/2019, 11/18/2019  . Tdap 01/16/2017    Diabetes Related Lab Review: Lab Results  Component Value Date   HGBA1C 5.2 05/25/2019    No results found for: MICROALBUR, MALB24HUR No results found for: CREATININE, BUN, NA, K, CL, CO2 Lab Results  Component Value Date   CHOL 160 05/25/2019   Lab Results  Component Value Date   HDL 62 05/25/2019   Lab Results  Component Value Date   LDLCALC 83 05/25/2019   Lab Results  Component Value Date   TRIG 69 05/25/2019   No results found for: CHOLHDL No results found for: LDLDIRECT The 10-year ASCVD risk score Denman George DC Jr., et al., 2013) is: 0.4%*   Values used to calculate the score:     Age: 31 years     Sex: Female     Is Non-Hispanic African American: No     Diabetic: No     Tobacco smoker: No     Systolic Blood Pressure: 136 mmHg     Is BP  treated: No     HDL Cholesterol: 62 mg/dL*     Total Cholesterol: 160 mg/dL*     * - Cholesterol units were assumed for this score calculation I have reviewed the PMH, Fam and Soc history. Patient Active Problem List   Diagnosis Date Noted  . Essential hypertension 09/16/2019    Priority: High  . IFG (impaired fasting glucose) 09/16/2019    Priority: High    Glucose 108 July 2020, a1c 5.2   . Oral lichen planus 09/16/2019    Priority: Medium  . Subclinical hypothyroidism 09/16/2019    Priority: Medium  . Overweight (BMI 25.0-29.9) 09/16/2019    Priority: Medium  . Primary localized osteoarthritis of right knee     Priority: Medium  . Diverticulosis 09/16/2019    Priority: Low    Colonoscopy 2019,wide  mouth.  Internal hemorrhoids      Social History: Patient  reports that she has never smoked. She has never used smokeless tobacco. She reports that she does not drink alcohol and does not use drugs.  Review of Systems: Ophthalmic: negative for eye pain, loss of vision or double vision Cardiovascular: negative for chest pain Respiratory: negative for SOB or persistent cough Gastrointestinal: negative for abdominal pain Genitourinary: negative for dysuria or gross hematuria MSK: negative for foot lesions Neurologic: negative for weakness or gait disturbance  Objective  Vitals: BP 136/84   Pulse 89   Temp (!) 97.5 F (36.4 C) (Temporal)   Resp 16   Ht 5\' 10"  (1.778 m)   Wt 181 lb 12.8 oz (82.5 kg)   SpO2 98%   BMI 26.09 kg/m  General: well appearing, no acute distress  Psych:  Alert and oriented, normal mood and affect     Commons side effects, risks, benefits, and alternatives for medications and treatment plan prescribed today were discussed, and the patient expressed understanding of the given instructions. Patient is instructed to call or message via MyChart if he/she has any questions or concerns regarding our treatment plan. No barriers to understanding were identified. We discussed Red Flag symptoms and signs in detail. Patient expressed understanding regarding what to do in case of urgent or emergency type symptoms.   Medication list was reconciled, printed and provided to the patient in AVS. Patient instructions and summary information was reviewed with the patient as documented in the AVS. This note was prepared with assistance of Dragon voice recognition software. Occasional wrong-word or sound-a-like substitutions may have occurred due to the inherent limitations of voice recognition software  This visit occurred during the SARS-CoV-2 public health emergency.  Safety protocols were in place, including screening questions prior to the visit, additional usage of staff  PPE, and extensive cleaning of exam room while observing appropriate contact time as indicated for disinfecting solutions.

## 2020-03-16 NOTE — Patient Instructions (Signed)
Please follow up as scheduled for your next visit with me: 05/06/2020   I will release your lab results to you on your MyChart account with further instructions. Please reply with any questions.   If you have any questions or concerns, please don't hesitate to send me a message via MyChart or call the office at [phone]. Thank you for visiting with [place] today! It's our pleasure caring for you.

## 2020-03-17 LAB — LIPID PANEL
Cholesterol: 186 mg/dL (ref <200)
HDL: 62 mg/dL (ref >=50)
LDL Cholesterol (Calc): 110 mg/dL (calc) — ABNORMAL HIGH
Non-HDL Cholesterol (Calc): 124 mg/dL (calc) (ref <130)
Total CHOL/HDL Ratio: 3 (calc) (ref <5.0)
Triglycerides: 56 mg/dL (ref <150)

## 2020-03-17 LAB — CBC WITH DIFFERENTIAL/PLATELET
Absolute Monocytes: 492 cells/uL (ref 200–950)
Basophils Absolute: 60 cells/uL (ref 0–200)
Basophils Relative: 1 %
Eosinophils Absolute: 120 cells/uL (ref 15–500)
Eosinophils Relative: 2 %
HCT: 40.7 % (ref 35.0–45.0)
Hemoglobin: 13.5 g/dL (ref 11.7–15.5)
Lymphs Abs: 1782 cells/uL (ref 850–3900)
MCH: 29.9 pg (ref 27.0–33.0)
MCHC: 33.2 g/dL (ref 32.0–36.0)
MCV: 90.2 fL (ref 80.0–100.0)
MPV: 9.7 fL (ref 7.5–12.5)
Monocytes Relative: 8.2 %
Neutro Abs: 3546 cells/uL (ref 1500–7800)
Neutrophils Relative %: 59.1 %
Platelets: 204 10*3/uL (ref 140–400)
RBC: 4.51 10*6/uL (ref 3.80–5.10)
RDW: 11.8 % (ref 11.0–15.0)
Total Lymphocyte: 29.7 %
WBC: 6 10*3/uL (ref 3.8–10.8)

## 2020-03-17 LAB — HEMOGLOBIN A1C
Hgb A1c MFr Bld: 5.3 % of total Hgb (ref <5.7)
Mean Plasma Glucose: 105 (calc)
eAG (mmol/L): 5.8 (calc)

## 2020-03-17 LAB — IRON,TIBC AND FERRITIN PANEL
%SAT: 14 % (calc) — ABNORMAL LOW (ref 16–45)
Ferritin: 20 ng/mL (ref 16–232)
Iron: 52 ug/dL (ref 40–190)
TIBC: 361 mcg/dL (calc) (ref 250–450)

## 2020-03-17 LAB — COMPREHENSIVE METABOLIC PANEL
AG Ratio: 1.9 (calc) (ref 1.0–2.5)
ALT: 7 U/L (ref 6–29)
AST: 18 U/L (ref 10–30)
Albumin: 4.3 g/dL (ref 3.6–5.1)
Alkaline phosphatase (APISO): 77 U/L (ref 31–125)
BUN: 15 mg/dL (ref 7–25)
CO2: 27 mmol/L (ref 20–32)
Calcium: 9.3 mg/dL (ref 8.6–10.2)
Chloride: 105 mmol/L (ref 98–110)
Creat: 0.83 mg/dL (ref 0.50–1.10)
Globulin: 2.3 g/dL (calc) (ref 1.9–3.7)
Glucose, Bld: 91 mg/dL (ref 65–99)
Potassium: 4.9 mmol/L (ref 3.5–5.3)
Sodium: 138 mmol/L (ref 135–146)
Total Bilirubin: 0.9 mg/dL (ref 0.2–1.2)
Total Protein: 6.6 g/dL (ref 6.1–8.1)

## 2020-03-17 LAB — T3: T3, Total: 128 ng/dL (ref 76–181)

## 2020-03-17 LAB — TSH: TSH: 3.99 mIU/L

## 2020-03-17 LAB — T4, FREE: Free T4: 1.1 ng/dL (ref 0.8–1.8)

## 2020-05-06 ENCOUNTER — Encounter: Payer: Self-pay | Admitting: Family Medicine

## 2020-05-06 ENCOUNTER — Other Ambulatory Visit: Payer: Self-pay

## 2020-05-06 ENCOUNTER — Ambulatory Visit (INDEPENDENT_AMBULATORY_CARE_PROVIDER_SITE_OTHER): Payer: 59 | Admitting: Family Medicine

## 2020-05-06 VITALS — BP 118/60 | HR 91 | Temp 98.1°F | Resp 16 | Ht 70.0 in | Wt 182.0 lb

## 2020-05-06 DIAGNOSIS — Z23 Encounter for immunization: Secondary | ICD-10-CM | POA: Diagnosis not present

## 2020-05-06 DIAGNOSIS — Z0000 Encounter for general adult medical examination without abnormal findings: Secondary | ICD-10-CM | POA: Diagnosis not present

## 2020-05-06 NOTE — Progress Notes (Signed)
Subjective  Chief Complaint  Patient presents with  . Annual Exam    labs previously drawn last month, flu shot given today    HPI: Candace [name] is a 44 y.o. female who presents to [facility] [place] today for a Female Wellness Visit. She also has the concerns and/or needs as listed above in the chief complaint. These will be addressed in addition to the Health Maintenance Visit.   Wellness Visit: annual visit with health maintenance review and exam without Pap   HM: up to date. Reviewed fasting labwork from visit in august.  Chronic disease f/u and/or acute problem visit: (deemed necessary to be done in addition to the wellness visit):  Reassured: no problems with sugar, thyroid or bp at this point.   She is eating healthy and running/exercising. Feeling well.  Assessment  1. Annual physical exam      Plan  Female Wellness Visit:  Age appropriate Health Maintenance and Prevention measures were discussed with patient. Included topics are cancer screening recommendations, ways to keep healthy (see AVS) including dietary and exercise recommendations, regular eye and dental care, use of seat belts, and avoidance of moderate alcohol use and tobacco use.   BMI: discussed patient's BMI and encouraged positive lifestyle modifications to help get to or maintain a target BMI.  HM needs and immunizations were addressed and ordered. See below for orders. See HM and immunization section for updates.  Routine labs and screening tests ordered including cmp, cbc and lipids where appropriate.  Discussed recommendations regarding Vit D and calcium supplementation (see AVS)  Chronic disease management visit and/or acute problem visit:  BP is normal. Resolved h/o preHTN or HTN. Will monitor. Low sodium diet and healthy lifestyle recommended.   Follow up: 12 mo for cpe  Orders Placed This Encounter  Procedures  . Flu Vaccine QUAD 6+ mos PF IM (Fluarix Quad PF)    No orders of the defined types were placed in this encounter.     Lifestyle: Body mass index is 26.11 kg/m. Wt Readings from Last 3 Encounters:  05/06/20 182 lb (82.6 kg)  03/16/20 181 lb 12.8 oz (82.5 kg)  12/17/19 186 lb (84.4 kg)     Patient Active Problem List   Diagnosis Date Noted  . Oral lichen planus 09/16/2019    Priority: Medium  . Overweight (BMI 25.0-29.9) 09/16/2019    Priority: Medium  . Primary localized osteoarthritis of right knee     Priority: Medium  . Diverticulosis 09/16/2019    Priority: Low    Colonoscopy 2019,wide mouth.  Internal hemorrhoids     Health Maintenance  Topic Date Due  . PAP SMEAR-Modifier  08/17/2022  . TETANUS/TDAP  01/17/2027  . INFLUENZA VACCINE  Completed  . COVID-19 Vaccine  Completed  . Hepatitis C Screening  Completed  . HIV Screening  Completed   Immunization History  Administered Date(s) Administered  . Influenza,inj,Quad PF,6+ Mos 05/06/2020  . Influenza-Unspecified 05/27/2019  . Moderna SARS-COVID-2 Vaccination 10/16/2019, 11/18/2019  . Tdap 01/16/2017   We updated and reviewed the patient's past history in detail and it is documented below. Allergies: Patient is allergic to lisinopril. Past Medical History Patient  has a past medical history of Bunion, left foot (05/29/2018), Diverticulosis (09/16/2019), Hallux valgus (acquired), left foot (05/29/2018), Hypertension, IFG (impaired fasting glucose) (1/61/09602/05/2020), Oral lichen planus, and Primary localized osteoarthritis of right knee. Past Surgical History Patient  has a past surgical history that includes Schlerotherapy; Mouth surgery; Bunionectomy; Hammer  toe surgery; and Fracture surgery. Family History: Patient family history includes ADD / ADHD in her son; Anxiety disorder in her daughter; Arthritis in her maternal grandmother; CAD in her paternal grandmother; Crohn's disease in her father; Depression in her brother; Diabetes in her maternal grandfather,  mother, and paternal grandmother; Heart disease in her maternal grandmother; High Cholesterol in her mother; Hyperlipidemia in her maternal grandmother; Hypertension in her maternal grandmother and mother; Kidney disease in her maternal grandfather and mother; Mental illness in her brother; Stroke in her paternal grandfather. Social History:  Patient  reports that she has never smoked. She has never used smokeless tobacco. She reports that she does not drink alcohol and does not use drugs.  Review of Systems: Constitutional: negative for fever or malaise Ophthalmic: negative for photophobia, double vision or loss of vision Cardiovascular: negative for chest pain, dyspnea on exertion, or new LE swelling Respiratory: negative for SOB or persistent cough Gastrointestinal: negative for abdominal pain, change in bowel habits or melena Genitourinary: negative for dysuria or gross hematuria, no abnormal uterine bleeding or disharge Musculoskeletal: negative for new gait disturbance or muscular weakness Integumentary: negative for new or persistent rashes, no breast lumps Neurological: negative for TIA or stroke symptoms Psychiatric: negative for SI or delusions Allergic/Immunologic: negative for hives  Patient Care Team    Relationship Specialty Notifications Start End  Willow Ora, MD PCP - General Family Medicine  09/16/19   Pyrtle, Carie Caddy, MD Consulting Physician Gastroenterology  09/16/19   Zelphia Cairo, MD Consulting Physician Obstetrics and Gynecology  09/16/19    [facility]    09/16/19   Blenda Bridegroom, MD Referring Physician Orthopedic Surgery  09/16/19   Consuela Mimes, MD Consulting Physician Family Medicine  09/16/19    Comment: vein specialist    Objective  Vitals: BP 118/60   Pulse 91   Temp 98.1 F (36.7 C) (Temporal)   Resp 16   Ht 5\' 10"  (1.778 m)   Wt 182 lb (82.6 kg)   SpO2 98%   BMI 26.11 kg/m  General:  Well developed, well nourished,  no acute distress  Psych:  Alert and orientedx3,normal mood and affect HEENT:  Normocephalic, atraumatic, non-icteric sclera,  supple neck without adenopathy, mass or thyromegaly Cardiovascular:  Normal S1, S2, RRR without gallop, rub or murmur Respiratory:  Good breath sounds bilaterally, CTAB with normal respiratory effort Gastrointestinal: normal bowel sounds, soft, non-tender, no noted masses. No HSM MSK: no deformities, contusions. Joints are without erythema or swelling.  Skin:  Warm, no rashes or suspicious lesions noted Neurologic:    Mental status is normal. CN 2-11 are normal. Gross motor and sensory exams are normal. Normal gait. No tremor  No visits with results within 1 Day(s) from this visit.  Latest known visit with results is:  Office Visit on 03/16/2020  Component Date Value Ref Range Status  . Cholesterol 03/16/2020 186  <200 mg/dL Final  . HDL 05/16/2020 62  > OR = 50 mg/dL Final  . Triglycerides 03/16/2020 56  <150 mg/dL Final  . LDL Cholesterol (Calc) 03/16/2020 110* mg/dL (calc) Final  . Total CHOL/HDL Ratio 03/16/2020 3.0  05/16/2020 (calc) Final  . Non-HDL Cholesterol (Calc) 03/16/2020 124  <130 mg/dL (calc) Final  . Hgb 05/16/2020 MFr Bld 03/16/2020 5.3  <5.7 % of total Hgb Final  . Mean Plasma Glucose 03/16/2020 105  (calc) Final  . eAG (mmol/L) 03/16/2020 5.8  (calc) Final  . T3, Total 03/16/2020 128  76 - 181  ng/dL Final  . Free T4 10/93/2355 1.1  0.8 - 1.8 ng/dL Final  . TSH 73/22/0254 3.99  mIU/L Final  . Iron 03/16/2020 52  40 - 190 mcg/dL Final  . TIBC 27/01/2375 361  250 - 450 mcg/dL (calc) Final  . %SAT 28/31/5176 14* 16 - 45 % (calc) Final  . Ferritin 03/16/2020 20  16 - 232 ng/mL Final  . Glucose, Bld 03/16/2020 91  65 - 99 mg/dL Final  . BUN 16/02/3709 15  7 - 25 mg/dL Final  . Creat 62/69/4854 0.83  0.50 - 1.10 mg/dL Final  . BUN/Creatinine Ratio 03/16/2020 NOT APPLICABLE  6 - 22 (calc) Final  . Sodium 03/16/2020 138  135 - 146 mmol/L Final  . Potassium  03/16/2020 4.9  3.5 - 5.3 mmol/L Final  . Chloride 03/16/2020 105  98 - 110 mmol/L Final  . CO2 03/16/2020 27  20 - 32 mmol/L Final  . Calcium 03/16/2020 9.3  8.6 - 10.2 mg/dL Final  . Total Protein 03/16/2020 6.6  6.1 - 8.1 g/dL Final  . Albumin 62/70/3500 4.3  3.6 - 5.1 g/dL Final  . Globulin 93/81/8299 2.3  1.9 - 3.7 g/dL (calc) Final  . AG Ratio 03/16/2020 1.9  1.0 - 2.5 (calc) Final  . Total Bilirubin 03/16/2020 0.9  0.2 - 1.2 mg/dL Final  . Alkaline phosphatase (APISO) 03/16/2020 77  31 - 125 U/L Final  . AST 03/16/2020 18  10 - 30 U/L Final  . ALT 03/16/2020 7  6 - 29 U/L Final  . WBC 03/16/2020 6.0  3.8 - 10.8 Thousand/uL Final  . RBC 03/16/2020 4.51  3.80 - 5.10 Million/uL Final  . Hemoglobin 03/16/2020 13.5  11.7 - 15.5 g/dL Final  . HCT 37/16/9678 40.7  35 - 45 % Final  . MCV 03/16/2020 90.2  80.0 - 100.0 fL Final  . MCH 03/16/2020 29.9  27.0 - 33.0 pg Final  . MCHC 03/16/2020 33.2  32.0 - 36.0 g/dL Final  . RDW 93/81/0175 11.8  11.0 - 15.0 % Final  . Platelets 03/16/2020 204  140 - 400 Thousand/uL Final  . MPV 03/16/2020 9.7  7.5 - 12.5 fL Final  . Neutro Abs 03/16/2020 3,546  1,500 - 7,800 cells/uL Final  . Lymphs Abs 03/16/2020 1,782  850 - 3,900 cells/uL Final  . Absolute Monocytes 03/16/2020 492  200 - 950 cells/uL Final  . Eosinophils Absolute 03/16/2020 120  15 - 500 cells/uL Final  . Basophils Absolute 03/16/2020 60  0 - 200 cells/uL Final  . Neutrophils Relative % 03/16/2020 59.1  % Final  . Total Lymphocyte 03/16/2020 29.7  % Final  . Monocytes Relative 03/16/2020 8.2  % Final  . Eosinophils Relative 03/16/2020 2.0  % Final  . Basophils Relative 03/16/2020 1.0  % Final      Commons side effects, risks, benefits, and alternatives for medications and treatment plan prescribed today were discussed, and the patient expressed understanding of the given instructions. Patient is instructed to call or message via MyChart if he/she has any questions or concerns  regarding our treatment plan. No barriers to understanding were identified. We discussed Red Flag symptoms and signs in detail. Patient expressed understanding regarding what to do in case of urgent or emergency type symptoms.   Medication list was reconciled, printed and provided to the patient in AVS. Patient instructions and summary information was reviewed with the patient as documented in the AVS. This note was prepared with assistance of Dragon voice recognition  software. Occasional wrong-word or sound-a-like substitutions may have occurred due to the inherent limitations of voice recognition software  This visit occurred during the SARS-CoV-2 public health emergency.  Safety protocols were in place, including screening questions prior to the visit, additional usage of staff PPE, and extensive cleaning of exam room while observing appropriate contact time as indicated for disinfecting solutions.

## 2020-05-06 NOTE — Patient Instructions (Signed)
Please return in 12 months for your annual complete physical; please come fasting.   If you have any questions or concerns, please don't hesitate to send me a message via MyChart or call the office at [phone]. Thank you for visiting with [place] today! It's our pleasure caring for you.  Please do these things to maintain good health!   Exercise at least 30-45 minutes a day,  4-5 days a week.   Eat a low-fat diet with lots of fruits and vegetables, up to 7-9 servings per day.  Drink plenty of water daily. Try to drink 8 8oz glasses per day.  Seatbelts can save your life. Always wear your seatbelt.  Place Smoke Detectors on every level of your home and check batteries every year.  Schedule an appointment with an eye doctor for an eye exam every 1-2 years  Safe sex - use condoms to protect yourself from STDs if you could be exposed to these types of infections. Use birth control if you do not want to become pregnant and are sexually active.  Avoid heavy alcohol use. If you drink, keep it to less than 2 drinks/day and not every day.  Health Care Power of Attorney.  Choose someone you trust that could speak for you if you became unable to speak for yourself.  Depression is common in our stressful world.If you're feeling down or losing interest in things you normally enjoy, please come in for a visit.  If anyone is threatening or hurting you, please get help. Physical or Emotional Violence is never OK.

## 2020-08-29 ENCOUNTER — Other Ambulatory Visit: Payer: Self-pay | Admitting: Obstetrics and Gynecology

## 2020-08-29 DIAGNOSIS — R928 Other abnormal and inconclusive findings on diagnostic imaging of breast: Secondary | ICD-10-CM

## 2020-09-01 ENCOUNTER — Ambulatory Visit
Admission: RE | Admit: 2020-09-01 | Discharge: 2020-09-01 | Disposition: A | Discharge status: Home / Self Care | Payer: 59 | Source: Ambulatory Visit | Attending: Obstetrics and Gynecology | Admitting: Obstetrics and Gynecology

## 2020-09-01 ENCOUNTER — Other Ambulatory Visit: Payer: Self-pay

## 2020-09-01 DIAGNOSIS — R928 Other abnormal and inconclusive findings on diagnostic imaging of breast: Secondary | ICD-10-CM

## 2020-09-07 ENCOUNTER — Other Ambulatory Visit: Payer: 59

## 2020-09-09 ENCOUNTER — Other Ambulatory Visit: Payer: 59

## 2021-04-18 ENCOUNTER — Encounter: Payer: Self-pay | Admitting: Family Medicine

## 2021-05-08 ENCOUNTER — Encounter: Payer: 59 | Admitting: Family Medicine

## 2021-05-25 ENCOUNTER — Encounter: Payer: 59 | Admitting: Family Medicine

## 2021-05-30 LAB — LIPID PANEL
Cholesterol: 172 (ref 0–200)
HDL: 60 (ref 35–70)
LDL Cholesterol: 92
LDl/HDL Ratio: 2.9
Triglycerides: 100 (ref 40–160)

## 2021-05-30 LAB — HEPATIC FUNCTION PANEL
ALT: 9 (ref 7–35)
AST: 17 (ref 13–35)
Alkaline Phosphatase: 74 (ref 25–125)
Bilirubin, Direct: 0.2 (ref 0.01–0.4)
Bilirubin, Total: 1.2

## 2021-05-30 LAB — COMPREHENSIVE METABOLIC PANEL
Albumin: 4.5 (ref 3.5–5.0)
Globulin: 2.5

## 2021-05-30 LAB — TSH: TSH: 5.5 (ref 0.41–5.90)

## 2021-05-30 LAB — BASIC METABOLIC PANEL: Glucose: 98

## 2021-05-30 LAB — HEMOGLOBIN A1C: Hemoglobin A1C: 5.2

## 2021-06-07 ENCOUNTER — Other Ambulatory Visit: Payer: Self-pay

## 2021-06-07 ENCOUNTER — Encounter: Payer: Self-pay | Admitting: Family Medicine

## 2021-06-07 ENCOUNTER — Ambulatory Visit (INDEPENDENT_AMBULATORY_CARE_PROVIDER_SITE_OTHER): Payer: 59 | Admitting: Family Medicine

## 2021-06-07 VITALS — BP 130/84 | HR 90 | Temp 97.9°F | Ht 70.0 in | Wt 174.4 lb

## 2021-06-07 DIAGNOSIS — Z23 Encounter for immunization: Secondary | ICD-10-CM

## 2021-06-07 DIAGNOSIS — E038 Other specified hypothyroidism: Secondary | ICD-10-CM | POA: Diagnosis not present

## 2021-06-07 DIAGNOSIS — Z0000 Encounter for general adult medical examination without abnormal findings: Secondary | ICD-10-CM | POA: Diagnosis not present

## 2021-06-07 NOTE — Patient Instructions (Signed)
Please return in 12 months for your annual complete physical; please come fasting.   Glad you are well!  If you have any questions or concerns, please don't hesitate to send me a message via MyChart or call the office at [phone]. Thank you for visiting with [place] today! It's our pleasure caring for you.

## 2021-06-07 NOTE — Progress Notes (Signed)
Subjective  Chief Complaint  Patient presents with   Annual Exam    Not fasting    HPI: Candace [name] is a 45 y.o. female who presents to [facility] at [place] today for a Female Wellness Visit.   Wellness Visit: annual visit with health maintenance review and exam without Pap  HM: all up to date. Flu shot today. Brings in copy of labwork. See media. All normal except TSH at around 5. Nl Free T4. Feels well. Healthy lifestyle. Home with kids who are now back in school. 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   Assessment  1. Annual physical exam   2. Need for immunization against influenza   3. Subclinical hypothyroidism      Plan  Female Wellness Visit: Age appropriate Health Maintenance and Prevention measures were discussed with patient. Included topics are cancer screening recommendations, ways to keep healthy (see AVS) including dietary and exercise recommendations, regular eye and dental care, use of seat belts, and avoidance of moderate alcohol use and tobacco use.  BMI: discussed patient's BMI and encouraged positive lifestyle modifications to help get to or maintain a target BMI. HM needs and immunizations were addressed and ordered. See below for orders. See HM and immunization section for updates. Flu shot today Routine labs and screening tests ordered including cmp, cbc and lipids where appropriate. Discussed recommendations regarding Vit D and calcium supplementation (see AVS) Discussed subclinical hypothyroidism: asymptomatic. Monitor annually.  Follow up: 12 mo for cpe   Orders Placed This Encounter  Procedures   Flu Vaccine QUAD 3mo+IM (Fluarix, Fluzone & Alfiuria Quad PF)   No orders of the defined types were placed in this encounter.      Body mass index is 25.02 kg/m. Wt Readings from Last 3 Encounters:  06/07/21 174 lb 6.4 oz (79.1 kg)  05/06/20 182  lb (82.6 kg)  03/16/20 181 lb 12.8 oz (82.5 kg)     Patient Active Problem List   Diagnosis Date Noted   Oral lichen planus [id number]    Priority: Medium    Subclinical hypothyroidism 09/16/2019    Priority: Medium    Primary localized osteoarthritis of right knee     Priority: Medium    Diverticulosis 09/16/2019    Priority: Low    Colonoscopy 2019,wide mouth.  Internal hemorrhoids     Health Maintenance  Topic Date Due   COVID-19 Vaccine (3 - Booster for Moderna series) 01/13/2020   PAP SMEAR-Modifier  08/17/2022   TETANUS/TDAP  01/17/2027   INFLUENZA VACCINE  Completed   Hepatitis C Screening  Completed   HIV Screening  Completed   Pneumococcal Vaccine 3-42 Years old  Aged Out   HPV VACCINES  Aged Out   Immunization History  Administered Date(s) Administered   Influenza,inj,Quad PF,6+ Mos 05/06/2020, 06/07/2021   Influenza-Unspecified 05/27/2019   Moderna Sars-Covid-2 Vaccination 10/16/2019, 11/18/2019   Tdap 01/16/2017   We updated and reviewed the patient's past history in detail and it is documented below. Allergies: Patient is allergic to lisinopril. Past Medical History Patient  has a past medical history of Bunion, left foot (05/29/2018), Diverticulosis (09/16/2019), Hallux valgus (acquired), left foot (05/29/2018), Hypertension, IFG (impaired fasting glucose) ([id number]), Oral lichen planus, and Primary localized osteoarthritis of right knee. Past Surgical History Patient  has a past surgical history that includes Schlerotherapy; Mouth surgery; Bunionectomy; Hammer toe surgery; and Fracture surgery. Family History:  Patient family history includes ADD / ADHD in her son; Anxiety disorder in her daughter; Arthritis in her maternal grandmother; CAD in her paternal grandmother; Crohn's disease in her father; Depression in her brother; Diabetes in her maternal grandfather, mother, and paternal grandmother; Heart disease in her maternal grandmother; High Cholesterol  in her mother; Hyperlipidemia in her maternal grandmother; Hypertension in her maternal grandmother and mother; Kidney disease in her maternal grandfather and mother; Mental illness in her brother; Stroke in her paternal grandfather. Social History:  Patient  reports that she has never smoked. She has never used smokeless tobacco. She reports that she does not drink alcohol and does not use drugs.  Review of Systems: Constitutional: negative for fever or malaise Ophthalmic: negative for photophobia, double vision or loss of vision Cardiovascular: negative for chest pain, dyspnea on exertion, or new LE swelling Respiratory: negative for SOB or persistent cough Gastrointestinal: negative for abdominal pain, change in bowel habits or melena Genitourinary: negative for dysuria or gross hematuria, no abnormal uterine bleeding or disharge Musculoskeletal: negative for new gait disturbance or muscular weakness Integumentary: negative for new or persistent rashes, no breast lumps Neurological: negative for TIA or stroke symptoms Psychiatric: negative for SI or delusions Allergic/Immunologic: negative for hives Patient Care Team    Relationship Specialty Notifications Start End  Willow Ora, MD PCP - General Family Medicine  09/16/19   Pyrtle, Carie Caddy, MD Consulting Physician Gastroenterology  09/16/19   Zelphia Cairo, MD Consulting Physician Obstetrics and Gynecology  09/16/19   [facility]    09/16/19   Blenda Bridegroom, MD Referring Physician Orthopedic Surgery  09/16/19   Consuela Mimes, MD Consulting Physician Family Medicine  09/16/19    Comment: vein specialist    Objective  Vitals: BP 130/84   Pulse 90   Temp 97.9 F (36.6 C) (Temporal)   Ht 5\' 10"  (1.778 m)   Wt 174 lb 6.4 oz (79.1 kg)   LMP 05/26/2021 (Approximate)   SpO2 98%   Breastfeeding No   BMI 25.02 kg/m  General:  Well developed, well nourished, no acute distress  Psych:  Alert and  orientedx3,normal mood and affect HEENT:  Normocephalic, atraumatic, non-icteric sclera, supple neck without adenopathy, mass or thyromegaly Cardiovascular:  Normal S1, S2, RRR without gallop, rub or murmur Respiratory:  Good breath sounds bilaterally, CTAB with normal respiratory effort Gastrointestinal: normal bowel sounds, soft, non-tender, no noted masses. No HSM MSK: no deformities, contusions. Joints are without erythema or swelling. Spine and CVA region are nontender Skin:  Warm, no rashes or suspicious lesions noted Neurologic:    Mental status is normal. Gross motor and sensory exams are normal. Normal gait. No tremor   Commons side effects, risks, benefits, and alternatives for medications and treatment plan prescribed today were discussed, and the patient expressed understanding of the given instructions. Patient is instructed to call or message via MyChart if he/she has any questions or concerns regarding our treatment plan. No barriers to understanding were identified. We discussed Red Flag symptoms and signs in detail. Patient expressed understanding regarding what to do in case of urgent or emergency type symptoms.  Medication list was reconciled, printed and provided to the patient in AVS. Patient instructions and summary information was reviewed with the patient as documented in the AVS. This note was prepared with assistance of Dragon voice recognition software. Occasional wrong-word or sound-a-like substitutions may have occurred due to the inherent limitations of voice recognition software  This visit occurred during  the SARS-CoV-2 public health emergency.  Safety protocols were in place, including screening questions prior to the visit, additional usage of staff PPE, and extensive cleaning of exam room while observing appropriate contact time as indicated for disinfecting solutions.

## 2021-08-25 ENCOUNTER — Encounter: Payer: Self-pay | Admitting: Family Medicine

## 2021-08-25 ENCOUNTER — Other Ambulatory Visit: Payer: Self-pay

## 2021-08-25 ENCOUNTER — Ambulatory Visit: Payer: 59 | Admitting: Family Medicine

## 2021-08-25 VITALS — Temp 98.1°F | Ht 70.0 in | Wt 178.2 lb

## 2021-08-25 DIAGNOSIS — E038 Other specified hypothyroidism: Secondary | ICD-10-CM

## 2021-08-25 DIAGNOSIS — R55 Syncope and collapse: Secondary | ICD-10-CM

## 2021-08-25 DIAGNOSIS — F419 Anxiety disorder, unspecified: Secondary | ICD-10-CM | POA: Diagnosis not present

## 2021-08-25 DIAGNOSIS — R030 Elevated blood-pressure reading, without diagnosis of hypertension: Secondary | ICD-10-CM

## 2021-08-25 DIAGNOSIS — R42 Dizziness and giddiness: Secondary | ICD-10-CM | POA: Diagnosis not present

## 2021-08-25 DIAGNOSIS — R790 Abnormal level of blood mineral: Secondary | ICD-10-CM

## 2021-08-25 DIAGNOSIS — R002 Palpitations: Secondary | ICD-10-CM

## 2021-08-25 LAB — CBC
HCT: 39.5 % (ref 36.0–46.0)
Hemoglobin: 12.9 g/dL (ref 12.0–15.0)
MCHC: 32.7 g/dL (ref 30.0–36.0)
MCV: 88.9 fl (ref 78.0–100.0)
Platelets: 235 10*3/uL (ref 150.0–400.0)
RBC: 4.44 Mil/uL (ref 3.87–5.11)
RDW: 12.4 % (ref 11.5–15.5)
WBC: 6.5 10*3/uL (ref 4.0–10.5)

## 2021-08-25 LAB — COMPREHENSIVE METABOLIC PANEL
ALT: 9 U/L (ref 0–35)
AST: 16 U/L (ref 0–37)
Albumin: 4.4 g/dL (ref 3.5–5.2)
Alkaline Phosphatase: 75 U/L (ref 39–117)
BUN: 10 mg/dL (ref 6–23)
CO2: 30 mEq/L (ref 19–32)
Calcium: 9.1 mg/dL (ref 8.4–10.5)
Chloride: 104 mEq/L (ref 96–112)
Creatinine, Ser: 0.76 mg/dL (ref 0.40–1.20)
GFR: 95.67 mL/min (ref >60.00)
Glucose, Bld: 90 mg/dL (ref 70–99)
Potassium: 4.7 mEq/L (ref 3.5–5.1)
Sodium: 139 mEq/L (ref 135–145)
Total Bilirubin: 0.7 mg/dL (ref 0.2–1.2)
Total Protein: 6.9 g/dL (ref 6.0–8.3)

## 2021-08-25 LAB — IBC + FERRITIN
Ferritin: 7.1 ng/mL — ABNORMAL LOW (ref 10.0–291.0)
Iron: 56 ug/dL (ref 42–145)
Saturation Ratios: 13.4 % — ABNORMAL LOW (ref 20.0–50.0)
TIBC: 418.6 ug/dL (ref 250.0–450.0)
Transferrin: 299 mg/dL (ref 212.0–360.0)

## 2021-08-25 LAB — TSH: TSH: 2.72 u[IU]/mL (ref 0.35–5.50)

## 2021-08-25 LAB — T4, FREE: Free T4: 0.78 ng/dL (ref 0.60–1.60)

## 2021-08-25 LAB — VITAMIN B12: Vitamin B-12: 542 pg/mL (ref 211–911)

## 2021-08-25 MED ORDER — ESCITALOPRAM OXALATE 5 MG PO TABS: 5.0000 mg | ORAL_TABLET | Freq: Every day | ORAL | 1 refills | Status: DC

## 2021-08-25 NOTE — Patient Instructions (Signed)
It was very nice to see you today!  Scheduling echo, event monitor.     PLEASE NOTE:  If you had any lab tests please let us know if you have not heard back within a few days. You may see your results on MyChart before we have a chance to review them but we will give you a call once they are reviewed by [place]. If we ordered any referrals today, please let us know if you have not heard from their office within the next week.   Please try these tips to maintain a healthy lifestyle:  Eat most of your calories during the day when you are active. Eliminate processed foods including packaged sweets (pies, cakes, cookies), reduce intake of potatoes, white bread, white pasta, and white rice. Look for whole grain options, oat flour or almond flour.  Each meal should contain half fruits/vegetables, one quarter protein, and one quarter carbs (no bigger than a computer mouse).  Cut down on sweet beverages. This includes juice, soda, and sweet tea. Also watch fruit intake, though this is a healthier sweet option, it still contains natural sugar! Limit to 3 servings daily.  Drink at least 1 glass of water with each meal and aim for at least 8 glasses per day  Exercise at least 150 minutes every week.
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
## 2021-08-28 ENCOUNTER — Telehealth: Payer: Self-pay | Admitting: Family Medicine

## 2021-08-28 NOTE — Telephone Encounter (Signed)
Pt was seen 08/25/21 by Ruthine Dose. Pt declined triage. Called pt this morning and states she is feeling slightly better. She states she has lots of questions regarding the Lexapro and her high blood pressure. She has an appt scheduled with Dr Mardelle Matte on 1/25 at 3:30 but she is wanting to talk to a nurse beforehand.   Patient Name: Candace [name] Gender: Female DOB: November 13, 1975 Age: 46 Y 10 M 12 D Return Phone Number: [phone] (Primary), [phone] (Secondary) Address: [place] [address] Client [facility] - Human resources officer [facility] Type Call Who Is Calling Patient / Member / Family / Caregiver Call Type Triage / Clinical Caller Name Casimiro Needle Relationship To Patient Spouse Return Phone Number [phone] (Secondary) Chief Complaint Headache Reason for Call Symptomatic / Request for Health Information Initial Comment Caller is calling for his wife who was seen at the office for dizziness, irregular heartbeat, nausea. Her blood pressure is 143/93, headache. Panic attack. Dr Margaretann Loveless. Translation No No Triage Reason Patient declined Nurse Assessment Nurse: Dan Humphreys, RN, Pacey Date/Time (Eastern Time): 08/25/2021 5:58:01 PM Confirm and document reason for call. If symptomatic, describe symptoms. ---Caller she was seen at the office for dizziness, irregular heartbeat. Her blood pressure is 143/93 and has a headache. Reports she believes she recently had a panic attack. Caller states her heart rate is showing as between 112 and 120. Caller states her husband has already called 911 and they are expecting an ambulance to arrive soon. Caller was encouraged to call us back if needed. Does the patient have any new or worsening symptoms? ---Yes Will a triage be completed? ---No Select reason for no triage. ---Patient declined Disp. Time Lamount Cohen Time) Disposition Final User 08/25/2021 6:02:52  PM Clinical Call Yes Dan Humphreys, RN, Pacey

## 2021-08-29 ENCOUNTER — Ambulatory Visit: Payer: 59

## 2021-08-29 NOTE — Progress Notes (Unsigned)
Enrolled for [employer] to mail a ZIO XT long term holter monitor to the patients address on file.  

## 2021-08-29 NOTE — Telephone Encounter (Signed)
Spoke with patient, She will be in office tomorrow.

## 2021-08-30 ENCOUNTER — Encounter: Payer: Self-pay | Admitting: Family Medicine

## 2021-08-30 ENCOUNTER — Ambulatory Visit: Payer: 59 | Admitting: Family Medicine

## 2021-08-30 ENCOUNTER — Other Ambulatory Visit: Payer: Self-pay

## 2021-08-30 VITALS — BP 142/80 | HR 92 | Temp 97.9°F | Wt 179.6 lb

## 2021-08-30 DIAGNOSIS — F419 Anxiety disorder, unspecified: Secondary | ICD-10-CM

## 2021-08-30 DIAGNOSIS — R42 Dizziness and giddiness: Secondary | ICD-10-CM | POA: Diagnosis not present

## 2021-08-30 MED ORDER — ALPRAZOLAM 0.25 MG PO TABS: 0.2500 mg | ORAL_TABLET | Freq: Every day | ORAL | 0 refills | Status: DC | PRN

## 2021-08-30 NOTE — Patient Instructions (Signed)
Please return in 3-4 weeks for recheck   If you have any questions or concerns, please don't hesitate to send me a message via MyChart or call the office at [phone]. Thank you for visiting with [place] today! It's our pleasure caring for you.   Stress, Adult Stress is a normal reaction to life events. Stress is what you feel when life demands more than you are used to, or more than you think you can handle. Some stress can be useful, such as studying for a test or meeting a deadline at work. Stress that occurs too often or for too long can cause problems. Long-lasting stress is called chronic stress. Chronic stress can affect your emotional health and interfere with relationships and normal daily activities. Too much stress can weaken your body's defense system (immune system) and increase your risk for physical illness. If you already have a medical problem, stress can make it worse. What are the causes? All sorts of life events can cause stress. An event that causes stress for one person may not be stressful for someone else. Major life events, whether positive or negative, commonly cause stress. Examples include: Losing a job or starting a new job. Losing a loved one. Moving to a new town or home. Getting married or divorced. Having a baby. Getting injured or sick. Less obvious life events can also cause stress, especially if they occur day after day or in combination with each other. Examples include: Working long hours. Driving in traffic. Caring for children. Being in debt. Being in a difficult relationship. What are the signs or symptoms? Stress can cause emotional and physical symptoms and can lead to unhealthy behaviors. These include the following: Emotional symptoms Anxiety. This is feeling worried, afraid, on edge, overwhelmed, or out of control. Anger, including irritation or impatience. Depression. This is feeling sad, down, helpless, or guilty. Trouble focusing,  remembering, or making decisions. Physical symptoms Aches and pains. These may affect your head, neck, back, stomach, or other areas of your body. Tight muscles or a clenched jaw. Low energy. Trouble sleeping. Unhealthy behaviors Eating to feel better (overeating) or skipping meals. Working too much or putting off tasks. Smoking, drinking alcohol, or using drugs to feel better. How is this diagnosed? A stress disorder is diagnosed through an assessment by your health care provider. A stress disorder may be diagnosed based on: Your symptoms and any stressful life events. Your medical history. Tests to rule out other causes of your symptoms. Depending on your condition, your health care provider may refer you to a specialist for further evaluation. How is this treated? Stress management techniques are the recommended treatment for stress. Medicine is not typically recommended for treating stress. Techniques to reduce your reaction to stressful life events include: Identifying stress. Monitor yourself for symptoms of stress and notice what causes stress for you. These skills may help you to avoid or prepare for stressful events. Managing time. Set your priorities, keep a calendar of events, and learn to say no. These actions can help you avoid taking on too much. Techniques for dealing with stress include: Rethinking the problem. Try to think realistically about stressful events rather than ignoring them or overreacting. Try to find the positives in a stressful situation rather than focusing on the negatives. Exercise. Physical exercise can release both physical and emotional tension. The key is to find a form of exercise that you enjoy and do it regularly. Relaxation techniques. These relax the body and mind. Find one or  more that you enjoy and use the techniques regularly. Examples include: Meditation, deep breathing, or progressive relaxation techniques. Yoga or tai chi. Biofeedback,  mindfulness techniques, or journaling. Listening to music, being in nature, or taking part in other hobbies. Practicing a healthy lifestyle. Eat a balanced diet, drink plenty of water, limit or avoid caffeine, and get plenty of sleep. Having a strong support network. Spend time with family, friends, or other people you enjoy being around. Express your feelings and talk things over with someone you trust. Counseling or talk therapy with a mental health provider may help if you are having trouble managing stress by yourself. Follow these instructions at home: Lifestyle  Avoid drugs. Do not use any products that contain nicotine or tobacco. These products include cigarettes, chewing tobacco, and vaping devices, such as e-cigarettes. If you need help quitting, ask your health care provider. If you drink alcohol: Limit how much you have to: 0-1 drink a day for women who are not pregnant. 0-2 drinks a day for men. Know how much alcohol is in a drink. In the [place] one drink equals one 12 oz bottle of beer (355 mL), one 5 oz glass of wine (148 mL), or one 1 oz glass of hard liquor (44 mL). Do not use alcohol or drugs to relax. Eat a balanced diet that includes fresh fruits and vegetables, whole grains, lean meats, fish, eggs, beans, and low-fat dairy. Avoid processed foods and foods high in added fat, sugar, and salt. Exercise at least 30 minutes on 5 or more days each week. Get 7-8 hours of sleep each night. General instructions  Practice stress management techniques as told by your health care provider. Drink enough fluid to keep your urine pale yellow. Take over-the-counter and prescription medicines only as told by your health care provider. Keep all follow-up visits. This is important. Contact a health care provider if: Your symptoms get worse. You have new symptoms. You feel overwhelmed by your problems and can no longer manage them by yourself. Get help right away if: You have thoughts  of hurting yourself or others. Get help right awayif you feel like you may hurt yourself or others, or have thoughts about taking your own life. Go to your nearest emergency room or: Call 911. Call the [facility] at (438)436-8536 or 988. This is open 24 hours a day. Text the Crisis Text Line at (520) 456-5967. Summary Stress is a normal reaction to life events. It can cause problems if it happens too often or for too long. Practicing stress management techniques is the best way to treat stress. Counseling or talk therapy with a mental health provider may help if you are having trouble managing stress by yourself. This information is not intended to replace advice given to you by your health care provider. Make sure you discuss any questions you have with your health care provider. Document Revised: 03/02/2021 Document Reviewed: 03/02/2021 Elsevier Patient Education  [place].

## 2021-08-30 NOTE — Progress Notes (Signed)
Subjective  CC:  Chief Complaint  Patient presents with   Dizziness    Pt states her when her BP is high she gets dizzy, lightheaded.     HPI: Candace [name] is a 46 y.o. female who presents to the office today to address the problems listed above in the chief complaint. Reviewed office visit notes from January 25 for same.  Patient is here for follow-up.  Describes 2-week history of lightheadedness, intermittently.  Often happens while she is driving.  Will feel lightheaded.  Becomes very anxious.  Intermittently will have palpitations.  No diaphoresis, nausea or sweating.  No chest pain.  Symptoms often happen while sitting.  No orthostatic symptoms.  No true vertigo.  No double vision or neurologic deficits.  Reviewed lab work showing mild iron deficiency but otherwise normal CBC, CMP and TSH.  Patient noted a few elevated blood pressures while she is feeling badly.  Typically will normalize.  She is very anxious and stressed about it.  Now anxious about driving.  He has a history of a panic attack but no history of mood disorder.  Lexapro was ordered but she has not yet started it.  She does admit that there are multiple stressors in her life over the last year.  However she has not felt that they have negatively affected her.  Assessment  1. Lightheadedness   2. Anxiety      Plan  Lightheadedness associated with anxiety: Reassured.  Doubt symptoms are related to high blood pressure.  Could be related to arrhythmia and 14-day Holter monitor was ordered.  However patient would like to defer this for now.  Also could be related to anxiety and panic symptoms.  Trial of low-dose Xanax to be used to see if this is helpful.  Patient will think about her stressors and see if she needs more help.  To continue with good hydration, sleep and nutrition.  No red flag symptoms noted.  Borderline blood pressure patient is very anxious.  Will monitor.  Lab work is all reassuring.  Recommend close  follow-up.  Discussed stress reduction techniques.  See after visit summary.  Follow up: 3 to 4 weeks to recheck Visit date not found  No orders of the defined types were placed in this encounter.  Meds ordered this encounter  Medications   ALPRAZolam (XANAX) 0.25 MG tablet    Sig: Take 1-2 tablets (0.25-0.5 mg total) by mouth daily as needed for anxiety.    Dispense:  20 tablet    Refill:  0      I reviewed the patients updated PMH, FH, and SocHx.    Patient Active Problem List   Diagnosis Date Noted   Oral lichen planus 09/16/2019    Priority: Medium    Subclinical hypothyroidism 09/16/2019    Priority: Medium    Primary localized osteoarthritis of right knee     Priority: Medium    Diverticulosis 09/16/2019    Priority: Low   Current Meds  Medication Sig   ALPRAZolam (XANAX) 0.25 MG tablet Take 1-2 tablets (0.25-0.5 mg total) by mouth daily as needed for anxiety.   escitalopram (LEXAPRO) 5 MG tablet Take 1 tablet (5 mg total) by mouth daily.   ferrous sulfate 324 MG TBEC Take 324 mg by mouth.   fluocinonide gel (LIDEX) 0.05 % Apply 1 application topically daily as needed (for gum flares).    Allergies: Patient is allergic to lisinopril. Family History: Patient family history includes ADD / ADHD  in her son; Anxiety disorder in her daughter; Arthritis in her maternal grandmother; CAD in her paternal grandmother; Crohn's disease in her father; Depression in her brother; Diabetes in her maternal grandfather, mother, and paternal grandmother; Heart disease in her maternal grandmother; High Cholesterol in her mother; Hyperlipidemia in her maternal grandmother; Hypertension in her maternal grandmother and mother; Kidney disease in her maternal grandfather and mother; Mental illness in her brother; Stroke in her paternal grandfather. Social History:  Patient  reports that she has never smoked. She has never used smokeless tobacco. She reports that she does not drink alcohol and  does not use drugs.  Review of Systems: Constitutional: Negative for fever malaise or anorexia Cardiovascular: negative for chest pain Respiratory: negative for SOB or persistent cough Gastrointestinal: negative for abdominal pain  Objective  Vitals: BP (!) 142/80    Pulse 92    Temp 97.9 F (36.6 C) (Temporal)    Wt 179 lb 9.6 oz (81.5 kg)    LMP 08/18/2021 (Approximate) Comment: ended about 1 week ago   SpO2 94%    BMI 25.77 kg/m  General: no acute distress , A&Ox3 Psych: Anxious appearing, fair insight HEENT: PEERL, conjunctiva normal, neck is supple Cardiovascular:  RRR without murmur or gallop.  Respiratory:  Good breath sounds bilaterally, CTAB with normal respiratory effort Skin:  Warm, no rashes Neuro: No tremor  No visits with results within 1 Day(s) from this visit.  Latest known visit with results is:  Office Visit on 08/25/2021  Component Date Value Ref Range Status   WBC 08/25/2021 6.5  4.0 - 10.5 K/uL Final   RBC 08/25/2021 4.44  3.87 - 5.11 Mil/uL Final   Platelets 08/25/2021 235.0  150.0 - 400.0 K/uL Final   Hemoglobin 08/25/2021 12.9  12.0 - 15.0 g/dL Final   HCT 16/10/960401/20/2023 39.5  36.0 - 46.0 % Final   MCV 08/25/2021 88.9  78.0 - 100.0 fl Final   MCHC 08/25/2021 32.7  30.0 - 36.0 g/dL Final   RDW 54/09/811901/20/2023 12.4  11.5 - 15.5 % Final   Sodium 08/25/2021 139  135 - 145 mEq/L Final   Potassium 08/25/2021 4.7  3.5 - 5.1 mEq/L Final   Chloride 08/25/2021 104  96 - 112 mEq/L Final   CO2 08/25/2021 30  19 - 32 mEq/L Final   Glucose, Bld 08/25/2021 90  70 - 99 mg/dL Final   BUN 14/78/295601/20/2023 10  6 - 23 mg/dL Final   Creatinine, Ser 08/25/2021 0.76  0.40 - 1.20 mg/dL Final   Total Bilirubin 08/25/2021 0.7  0.2 - 1.2 mg/dL Final   Alkaline Phosphatase 08/25/2021 75  39 - 117 U/L Final   AST 08/25/2021 16  0 - 37 U/L Final   ALT 08/25/2021 9  0 - 35 U/L Final   Total Protein 08/25/2021 6.9  6.0 - 8.3 g/dL Final   Albumin 21/30/865701/20/2023 4.4  3.5 - 5.2 g/dL Final   GFR  84/69/629501/20/2023 95.67  >60.00 mL/min Final   Calcium 08/25/2021 9.1  8.4 - 10.5 mg/dL Final   TSH 28/41/324401/20/2023 2.72  0.35 - 5.50 uIU/mL Final   Free T4 08/25/2021 0.78  0.60 - 1.60 ng/dL Final   Vitamin W-10B-12 27/25/366401/20/2023 542  211 - 911 pg/mL Final   Iron 08/25/2021 56  42 - 145 ug/dL Final   Transferrin 40/34/742501/20/2023 299.0  212.0 - 360.0 mg/dL Final   Saturation Ratios 08/25/2021 13.4 (L)  20.0 - 50.0 % Final   Ferritin 08/25/2021 7.1 (L)  10.0 -  291.0 ng/mL Final   TIBC 08/25/2021 418.6  250.0 - 450.0 mcg/dL Final    Commons side effects, risks, benefits, and alternatives for medications and treatment plan prescribed today were discussed, and the patient expressed understanding of the given instructions. Patient is instructed to call or message via MyChart if he/she has any questions or concerns regarding our treatment plan. No barriers to understanding were identified. We discussed Red Flag symptoms and signs in detail. Patient expressed understanding regarding what to do in case of urgent or emergency type symptoms.  Medication list was reconciled, printed and provided to the patient in AVS. Patient instructions and summary information was reviewed with the patient as documented in the AVS. This note was prepared with assistance of Dragon voice recognition software. Occasional wrong-word or sound-a-like substitutions may have occurred due to the inherent limitations of voice recognition software  This visit occurred during the SARS-CoV-2 public health emergency.  Safety protocols were in place, including screening questions prior to the visit, additional usage of staff PPE, and extensive cleaning of exam room while observing appropriate contact time as indicated for disinfecting solutions.

## 2021-08-31 ENCOUNTER — Encounter: Payer: Self-pay | Admitting: Family Medicine

## 2021-09-12 ENCOUNTER — Other Ambulatory Visit: Payer: Self-pay | Admitting: Obstetrics and Gynecology

## 2021-09-12 DIAGNOSIS — R928 Other abnormal and inconclusive findings on diagnostic imaging of breast: Secondary | ICD-10-CM

## 2021-09-14 ENCOUNTER — Ambulatory Visit: Payer: 59

## 2021-09-14 ENCOUNTER — Ambulatory Visit
Admission: RE | Admit: 2021-09-14 | Discharge: 2021-09-14 | Disposition: A | Discharge status: Home / Self Care | Payer: 59 | Source: Ambulatory Visit | Attending: Obstetrics and Gynecology | Admitting: Obstetrics and Gynecology

## 2021-09-14 DIAGNOSIS — R928 Other abnormal and inconclusive findings on diagnostic imaging of breast: Secondary | ICD-10-CM

## 2021-09-16 ENCOUNTER — Other Ambulatory Visit: Payer: Self-pay | Admitting: Family Medicine

## 2021-09-20 ENCOUNTER — Telehealth: Payer: Self-pay | Admitting: *Deleted

## 2021-09-20 NOTE — Telephone Encounter (Signed)
We received notification from [employer] that the 14 day ZIO XT monitor mailed to the patient on 08/29/21, was returned without any data on the recorder.    Patient stated she has received a second opinion, and decided not to wear the monitor. She returned it to [facility] unused. Patient will not be charged for the unused device.  I will cancel her order.

## 2021-10-04 ENCOUNTER — Telehealth (HOSPITAL_BASED_OUTPATIENT_CLINIC_OR_DEPARTMENT_OTHER): Payer: Self-pay | Admitting: *Deleted

## 2021-10-04 NOTE — Telephone Encounter (Signed)
Left message for patient to call and schedule Echo ordered by Dr. Cherlynn Kaiser ?

## 2021-10-06 NOTE — Telephone Encounter (Signed)
Spoke with patient--she states she saw another provider for a second opinion and does not wish to have testing done.  Will cancel order ?

## 2021-10-18 ENCOUNTER — Encounter: Payer: Self-pay | Admitting: Family Medicine

## 2021-10-18 ENCOUNTER — Ambulatory Visit: Payer: 59 | Admitting: Family Medicine

## 2021-10-18 VITALS — BP 125/86 | HR 78 | Temp 97.7°F | Ht 70.0 in | Wt 183.0 lb

## 2021-10-18 DIAGNOSIS — R42 Dizziness and giddiness: Secondary | ICD-10-CM

## 2021-10-18 DIAGNOSIS — F419 Anxiety disorder, unspecified: Secondary | ICD-10-CM

## 2021-10-18 NOTE — Progress Notes (Signed)
? ? ?Subjective  ?CC:  ?Chief Complaint  ?Patient presents with  ? Dizziness  ? Anxiety  ?  Given low dose Xanax. Recommended hydration and sleep. Pt stated that since her last visit she has not need to take medication and has not experienced any dizzness.  ? ? ?HPI: Candace [name] is a 46 y.o. female who presents to the office today to address the problems listed above in the chief complaint, mood problems. ?Anxiety follow-up: See last note.  Fortunately, she is doing much better.  No longer with any lightheadedness or dizziness symptoms.  We discussed stress management.  She is done well with this.  Never needed Xanax but like twice daily and having it in case.  She has been driving, had several trips to [facility] and did fairly well.  Stressors persist but are improving.  Son had dental surgery and things went well.  Denies any problems.  Today feels perfectly well.  No concerns.  Negative chest pain, palpitations, lightheadedness, vertigo, paresis. ? ?Depression screen [facility] 2/9 06/07/2021 05/06/2020 12/17/2019  ?Decreased Interest 0 0 0  ?Down, Depressed, Hopeless 0 0 0  ?PHQ - 2 Score 0 0 0  ? ?GAD 7 : Generalized Anxiety Score 10/18/2021  ?Nervous, Anxious, on Edge 1  ?Control/stop worrying 1  ?Worry too much - different things 1  ?Trouble relaxing 1  ?Restless 0  ?Easily annoyed or irritable 0  ?Afraid - awful might happen 0  ?Total GAD 7 Score 4  ?Anxiety Difficulty Not difficult at all  ? ? ? ?Assessment  ?1. Anxiety   ?2. Lightheadedness   ? ?  ?Plan  ?Lightheadedness secondary to stress reaction/anxiety.  Patient has good insight.  Coping well.  Discussed stress reduction behaviors.  No mood disorder.  No concerns for heart disease or arrhythmia.  She will return if symptoms recur. ? ?Follow up: November for complete physical ?No orders of the defined types were placed in this encounter. ? ?No orders of the defined types were placed in this encounter. ? ?  ? ?I reviewed the patients updated PMH, FH, and  SocHx.  ?  ?Patient Active Problem List  ? Diagnosis Date Noted  ? Oral lichen planus 09/16/2019  ?  Priority: Medium   ? Subclinical hypothyroidism 09/16/2019  ?  Priority: Medium   ? Primary localized osteoarthritis of right knee   ?  Priority: Medium   ? Diverticulosis 09/16/2019  ?  Priority: Low  ? ?Current Meds  ?Medication Sig  ? ALPRAZolam (XANAX) 0.25 MG tablet Take 1-2 tablets (0.25-0.5 mg total) by mouth daily as needed for anxiety.  ? escitalopram (LEXAPRO) 5 MG tablet TAKE 1 TABLET (5 MG TOTAL) BY MOUTH DAILY.  ? ferrous sulfate 324 MG TBEC Take 324 mg by mouth.  ? fluocinonide gel (LIDEX) 0.05 % Apply 1 application topically daily as needed (for gum flares).  ? ? ?Allergies: ?Patient is allergic to lisinopril. ?Family history:  ?Patient family history includes ADD / ADHD in her son; Anxiety disorder in her daughter; Arthritis in her maternal grandmother; CAD in her paternal grandmother; Crohn's disease in her father; Depression in her brother; Diabetes in her maternal grandfather, mother, and paternal grandmother; Heart disease in her maternal grandmother; High Cholesterol in her mother; Hyperlipidemia in her maternal grandmother; Hypertension in her maternal grandmother and mother; Kidney disease in her maternal grandfather and mother; Mental illness in her brother; Stroke in her paternal grandfather. ?Social History  ? ?Socioeconomic History  ? Marital  status: Married  ?  Spouse name: Not on file  ? Number of children: 2  ? Years of education: Not on file  ? Highest education level: Not on file  ?Occupational History  ? Occupation: occupational therapist, masters  ?  Employer: [employer]  ?Tobacco Use  ? Smoking status: Never  ? Smokeless tobacco: Never  ?Vaping Use  ? Vaping Use: Never used  ?Substance and Sexual Activity  ? Alcohol use: No  ? Drug use: No  ? Sexual activity: Yes  ?Other Topics Concern  ? Not on file  ?Social History Narrative  ? Not on file  ? ?Social Determinants of Health   ? ?Financial Resource Strain: Not on file  ?Food Insecurity: Not on file  ?Transportation Needs: Not on file  ?Physical Activity: Not on file  ?Stress: Not on file  ?Social Connections: Not on file  ? ? ? ?Review of Systems: ?Constitutional: Negative for fever malaise or anorexia ?Cardiovascular: negative for chest pain ?Respiratory: negative for SOB or persistent cough ?Gastrointestinal: negative for abdominal pain ? ?Objective  ?Vitals: BP 125/86   Pulse 78   Temp 97.7 ?F (36.5 ?C)   Ht 5\' 10"  (1.778 m)   Wt 183 lb (83 kg)   LMP 10/09/2021   SpO2 100%   BMI 26.26 kg/m?  ?General: no acute distress, well appearing, no apparent distress, well groomed ?Psych:  Alert and oriented x 3,normal mood, behavior, speech, dress, and thought processes.  ? ? ? ?Commons side effects, risks, benefits, and alternatives for medications and treatment plan prescribed today were discussed, and the patient expressed understanding of the given instructions. Patient is instructed to call or message via MyChart if he/she has any questions or concerns regarding our treatment plan. No barriers to understanding were identified. We discussed Red Flag symptoms and signs in detail. Patient expressed understanding regarding what to do in case of urgent or emergency type symptoms.  ?Medication list was reconciled, printed and provided to the patient in AVS. Patient instructions and summary information was reviewed with the patient as documented in the AVS. ?This note was prepared with assistance of 12/09/2021. Occasional wrong-word or sound-a-like substitutions may have occurred due to the inherent limitations of voice recognition software ? ? ? ? ?

## 2022-04-30 ENCOUNTER — Encounter: Payer: Self-pay | Admitting: *Deleted

## 2022-05-30 LAB — BASIC METABOLIC PANEL: Glucose: 93

## 2022-06-08 ENCOUNTER — Ambulatory Visit (INDEPENDENT_AMBULATORY_CARE_PROVIDER_SITE_OTHER): Payer: 59 | Admitting: Family Medicine

## 2022-06-08 ENCOUNTER — Encounter: Payer: Self-pay | Admitting: Family Medicine

## 2022-06-08 VITALS — BP 127/77 | HR 82 | Temp 97.7°F | Ht 70.0 in | Wt 184.8 lb

## 2022-06-08 DIAGNOSIS — R7401 Elevation of levels of liver transaminase levels: Secondary | ICD-10-CM | POA: Diagnosis not present

## 2022-06-08 DIAGNOSIS — F410 Panic disorder [episodic paroxysmal anxiety] without agoraphobia: Secondary | ICD-10-CM | POA: Diagnosis not present

## 2022-06-08 DIAGNOSIS — Z0000 Encounter for general adult medical examination without abnormal findings: Secondary | ICD-10-CM | POA: Diagnosis not present

## 2022-06-08 DIAGNOSIS — E038 Other specified hypothyroidism: Secondary | ICD-10-CM | POA: Diagnosis not present

## 2022-06-08 DIAGNOSIS — D500 Iron deficiency anemia secondary to blood loss (chronic): Secondary | ICD-10-CM

## 2022-06-08 NOTE — Progress Notes (Signed)
Subjective  Chief Complaint  Patient presents with   Annual Exam    HPI: Candace [name] is a 46 y.o. female who presents to [facility] [place] today for a Female Wellness Visit. She also has the concerns and/or needs as listed above in the chief complaint. These will be addressed in addition to the Health Maintenance Visit.   Wellness Visit: annual visit with health maintenance review and exam without Pap  Health maintenance: Sees GYN for female wellness.  Screens are current.  Exercising regularly.  Eats well.  Home life is good.  She brings in lab work with her.  Reviewed today and abstracted Chronic disease f/u and/or acute problem visit: (deemed necessary to be done in addition to the wellness visit): Screening labs showed an AST of 40.  She does not use Tylenol nor does she drink alcohol.  Diet is healthy.  No history of elevated LFTs.  No upper abdominal pain.  No jaundice.  Low risk of exposures to hepatitis B or C Panic disorder: Was here last in March and was trying to avoid medications for panic symptoms.  However, symptoms continue to persist and slightly worsened.  She did end up starting Lexapro 5 mg daily.  Fortunately, it is worked very well for her.  She is no longer having fear, anxiety, lightheadedness, or panic.  Tolerating the medicines well.  No adverse effects.  Her GYN refilled for a year. History of subclinical hypothyroidism: TSH was 5.19 last year however normalized this year around 3.  No symptoms of high or low thyroid. Labs reviewed and ferritin is 9.  She has regular heavy menstrual cycles.  She did take iron for about 3 months last year.  However not taking regularly.  She is not anemic.  Assessment  1. Annual physical exam   2. Elevated AST (SGOT)   3. Panic disorder   4. Subclinical hypothyroidism   5. Iron deficiency anemia due to chronic blood loss      Plan  Female Wellness Visit: Age appropriate Health Maintenance and  Prevention measures were discussed with patient. Included topics are cancer screening recommendations, ways to keep healthy (see AVS) including dietary and exercise recommendations, regular eye and dental care, use of seat belts, and avoidance of moderate alcohol use and tobacco use.  Screens are current BMI: discussed patient's BMI and encouraged positive lifestyle modifications to help get to or maintain a target BMI. HM needs and immunizations were addressed and ordered. See below for orders. See HM and immunization section for updates. Routine labs and screening tests ordered including cmp, cbc and lipids where appropriate. Discussed recommendations regarding Vit D and calcium supplementation (see AVS)  Chronic disease management visit and/or acute problem visit: Elevated AST: Recommend recheck in 6 weeks. History of subclinical hypothyroidism: We will monitor annually.  Currently normal Iron deficiency without anemia: Recommend iron therapy over-the-counter daily while menstruating. Panic disorder: Now well controlled on Lexapro 5 mg daily.  We will continue for at least 1 year.  Counseling education given  Follow up: 12 months for complete physical and follow-up Orders Placed This Encounter  Procedures   Hepatic function panel   No orders of the defined types were placed in this encounter.     Body mass index is 26.52 kg/m. Wt Readings from Last 3 Encounters:  06/08/22 184 lb 12.8 oz (83.8 kg)  10/18/21 183 lb (83 kg)  08/30/21 179 lb 9.6 oz (81.5 kg)     Patient  Active Problem List   Diagnosis Date Noted   Oral lichen planus 09/16/2019    Priority: Medium    Subclinical hypothyroidism 09/16/2019    Priority: Medium    Primary localized osteoarthritis of right knee     Priority: Medium    Diverticulosis 09/16/2019    Priority: Low    Colonoscopy 2019,wide mouth.  Internal hemorrhoids     Panic disorder 06/08/2022   Health Maintenance  Topic Date Due   PAP  SMEAR-Modifier  08/17/2024   TETANUS/TDAP  01/17/2027   INFLUENZA VACCINE  Completed   Hepatitis C Screening  Completed   HIV Screening  Completed   HPV VACCINES  Aged Out   COVID-19 Vaccine  Discontinued   Immunization History  Administered Date(s) Administered   Influenza,inj,Quad PF,6+ Mos 05/06/2020, 06/07/2021, 06/04/2022   Influenza-Unspecified 05/27/2019   Moderna Sars-Covid-2 Vaccination 10/16/2019, 11/18/2019   Tdap 01/16/2017   We updated and reviewed the patient's past history in detail and it is documented below. Allergies: Patient is allergic to lisinopril. Past Medical History Patient  has a past medical history of Bunion, left foot (05/29/2018), Diverticulosis (09/16/2019), Hallux valgus (acquired), left foot (05/29/2018), Hypertension, IFG (impaired fasting glucose) (8/65/7846), Oral lichen planus, and Primary localized osteoarthritis of right knee. Past Surgical History Patient  has a past surgical history that includes Schlerotherapy; Mouth surgery; Bunionectomy; Hammer toe surgery; and Fracture surgery. Family History: Patient family history includes ADD / ADHD in her son; Anxiety disorder in her daughter; Arthritis in her maternal grandmother; CAD in her paternal grandmother; Crohn's disease in her father; Depression in her brother; Diabetes in her maternal grandfather, mother, and paternal grandmother; Heart disease in her maternal grandmother; High Cholesterol in her mother; Hyperlipidemia in her maternal grandmother; Hypertension in her maternal grandmother and mother; Kidney disease in her maternal grandfather and mother; Mental illness in her brother; Stroke in her paternal grandfather. Social History:  Patient  reports that she has never smoked. She has never used smokeless tobacco. She reports that she does not drink alcohol and does not use drugs.  Review of Systems: Constitutional: negative for fever or malaise Ophthalmic: negative for photophobia, double  vision or loss of vision Cardiovascular: negative for chest pain, dyspnea on exertion, or new LE swelling Respiratory: negative for SOB or persistent cough Gastrointestinal: negative for abdominal pain, change in bowel habits or melena Genitourinary: negative for dysuria or gross hematuria, no abnormal uterine bleeding or disharge Musculoskeletal: negative for new gait disturbance or muscular weakness Integumentary: negative for new or persistent rashes, no breast lumps Neurological: negative for TIA or stroke symptoms Psychiatric: negative for SI or delusions Allergic/Immunologic: negative for hives  Patient Care Team    Relationship Specialty Notifications Start End  Willow Ora, MD PCP - General Family Medicine  09/16/19   Pyrtle, Carie Caddy, MD Consulting Physician Gastroenterology  09/16/19   Zelphia Cairo, MD Consulting Physician Obstetrics and Gynecology  09/16/19   [facility]    09/16/19   Blenda Bridegroom, MD Referring Physician Orthopedic Surgery  09/16/19   Consuela Mimes, MD Consulting Physician Family Medicine  09/16/19    Comment: vein specialist    Objective  Vitals: BP 127/77   Pulse 82   Temp 97.7 F (36.5 C) (Temporal)   Ht 5\' 10"  (1.778 m)   Wt 184 lb 12.8 oz (83.8 kg)   LMP 05/25/2022   SpO2 96%   BMI 26.52 kg/m  General:  Well developed, well nourished, no acute distress  Psych:  Alert and orientedx3,normal mood and affect HEENT:  Normocephalic, atraumatic, non-icteric sclera,  supple neck without adenopathy, mass or thyromegaly Cardiovascular:  Normal S1, S2, RRR without gallop, rub or murmur Respiratory:  Good breath sounds bilaterally, CTAB with normal respiratory effort Gastrointestinal: normal bowel sounds, soft, non-tender, no noted masses. No HSM MSK: no deformities, contusions. Joints are without erythema or swelling.  Skin:  Warm, no rashes or suspicious lesions noted Neurologic:    Mental status is normal.  Gross motor and  sensory exams are normal. Normal gait. No tremor   Commons side effects, risks, benefits, and alternatives for medications and treatment plan prescribed today were discussed, and the patient expressed understanding of the given instructions. Patient is instructed to call or message via MyChart if he/she has any questions or concerns regarding our treatment plan. No barriers to understanding were identified. We discussed Red Flag symptoms and signs in detail. Patient expressed understanding regarding what to do in case of urgent or emergency type symptoms.  Medication list was reconciled, printed and provided to the patient in AVS. Patient instructions and summary information was reviewed with the patient as documented in the AVS. This note was prepared with assistance of Dragon voice recognition software. Occasional wrong-word or sound-a-like substitutions may have occurred due to the inherent limitations of voice recognition software

## 2022-06-08 NOTE — Patient Instructions (Signed)

## 2022-07-19 ENCOUNTER — Other Ambulatory Visit (INDEPENDENT_AMBULATORY_CARE_PROVIDER_SITE_OTHER): Payer: 59

## 2022-07-19 DIAGNOSIS — R7401 Elevation of levels of liver transaminase levels: Secondary | ICD-10-CM | POA: Diagnosis not present

## 2022-07-19 LAB — HEPATIC FUNCTION PANEL
ALT: 14 U/L (ref 0–35)
AST: 26 U/L (ref 0–37)
Albumin: 4.1 g/dL (ref 3.5–5.2)
Alkaline Phosphatase: 81 U/L (ref 39–117)
Bilirubin, Direct: 0.1 mg/dL (ref 0.0–0.3)
Total Bilirubin: 0.7 mg/dL (ref 0.2–1.2)
Total Protein: 6.3 g/dL (ref 6.0–8.3)

## 2022-09-14 ENCOUNTER — Encounter: Payer: Self-pay | Admitting: Nurse Practitioner

## 2022-09-26 ENCOUNTER — Encounter: Payer: Self-pay | Admitting: Family Medicine

## 2022-10-17 ENCOUNTER — Ambulatory Visit: Payer: 59 | Admitting: Physician Assistant

## 2022-10-18 ENCOUNTER — Encounter: Payer: Self-pay | Admitting: Family Medicine

## 2022-10-23 ENCOUNTER — Ambulatory Visit: Payer: 59 | Admitting: Nurse Practitioner

## 2022-11-30 ENCOUNTER — Ambulatory Visit (INDEPENDENT_AMBULATORY_CARE_PROVIDER_SITE_OTHER): Payer: 59 | Admitting: Family Medicine

## 2022-11-30 ENCOUNTER — Encounter: Payer: Self-pay | Admitting: Family Medicine

## 2022-11-30 VITALS — BP 118/80 | HR 83 | Temp 98.3°F | Ht 70.0 in | Wt 192.2 lb

## 2022-11-30 DIAGNOSIS — E663 Overweight: Secondary | ICD-10-CM

## 2022-11-30 DIAGNOSIS — F410 Panic disorder [episodic paroxysmal anxiety] without agoraphobia: Secondary | ICD-10-CM | POA: Diagnosis not present

## 2022-11-30 MED ORDER — PHENTERMINE HCL 37.5 MG PO TABS: 18.7250 mg | ORAL_TABLET | Freq: Every day | ORAL | 2 refills | Status: DC

## 2022-11-30 NOTE — Patient Instructions (Signed)
Please return in 2-3 months for recheck mood and weight  Please call [facility] to schedule an appointment with Dr. Fredia Sorrow; she is a therapist here at our [place] office.  The phone number is: [phone]   If you have any questions or concerns, please don't hesitate to send me a message via MyChart or call the office at [phone]. Thank you for visiting with [place] today! It's our pleasure caring for you.

## 2022-11-30 NOTE — Progress Notes (Signed)
Subjective  CC:  Chief Complaint  Patient presents with   Weight Gain    Pt is concerned that Lexapro is making her gain weight.    HPI: Candace [name] is a 47 y.o. female who presents to the office today to address the problems listed above in the chief complaint. Weight gain: Patient concerned about persistent weight gain.  In the past, she has been successful losing weight on weight watchers.  However over the last couple of years, in spite of healthy diet, regular exercise she has been gaining weight.  She did start Lexapro approximately 2 years ago for panic disorder.  It has worked well.  She takes 5 daily.  However, does state that it is harder for her to lose weight on this medication.  She has regular menstrual cycles and does not have perimenopausal symptoms.  She exercises at the gym, mainly aerobic activity.  Some weight training.  She limits her portions and eats many salads.  She does not count her calories currently. Mood: Overall has been well-controlled on the Lexapro.  However, does struggle with some issues, she is a busy full-time,, works, has some issues, longstanding with the relationship with her mother.  Today she is tearful.  She does feel Lexapro continues to help manage her anxiety well.  She does not have any panic symptoms currently. Wt Readings from Last 3 Encounters:  11/30/22 192 lb 4 oz (87.2 kg)  06/08/22 184 lb 12.8 oz (83.8 kg)  10/18/21 183 lb (83 kg)  Last TSH: 2024: 3.5  Assessment  1. Overweight (BMI 25.0-29.9)   2. Panic disorder      Plan  Overweight: And a long discussion regarding weight management.  Weight gain likely multifactorial: Next appropriately plan overall, diet, exercise and perimenopausal hormone changes and metabolic changes.  We discussed management strategies to include counting calories, eating clean, increasing strength training to raise metabolic rate, good sleep, good hydration, increasing protein intake, trial of low-dose  phentermine.  Monitor for side effects. Panic disorder: Recommend counseling.  She is a verbal process started think she would do well with counseling.  Continue Lexapro 5 mg without a make it slightly harder to lose weight.  Once things have improved, we can consider coming off of this medication.  Patient agrees  Follow up: 3 months to recheck Visit date not found  No orders of the defined types were placed in this encounter.  Meds ordered this encounter  Medications   phentermine (ADIPEX-P) 37.5 MG tablet    Sig: Take 0.5-1 tablets (18.725-37.5 mg total) by mouth daily before breakfast.    Dispense:  30 tablet    Refill:  2      I reviewed the patients updated PMH, FH, and SocHx.    Patient Active Problem List   Diagnosis Date Noted   Oral lichen planus 09/16/2019    Priority: Medium    Subclinical hypothyroidism 09/16/2019    Priority: Medium    Primary localized osteoarthritis of right knee     Priority: Medium    Diverticulosis 09/16/2019    Priority: Low   Panic disorder 06/08/2022   Current Meds  Medication Sig   BIOTIN PO Take 1 each by mouth as needed.   Cholecalciferol (VITAMIN D3) 50 MCG (2000 UT) CAPS Take 1 capsule by mouth daily in the afternoon.   escitalopram (LEXAPRO) 5 MG tablet TAKE 1 TABLET (5 MG TOTAL) BY MOUTH DAILY.   ferrous sulfate 324 MG TBEC Take 324  mg by mouth.   fluocinonide gel (LIDEX) 0.05 % Apply 1 application topically daily as needed (for gum flares).   Multiple Vitamin (MULTIVITAMIN) tablet Take 1 tablet by mouth daily.   phentermine (ADIPEX-P) 37.5 MG tablet Take 0.5-1 tablets (18.725-37.5 mg total) by mouth daily before breakfast.    Allergies: Patient is allergic to lisinopril. Family History: Patient family history includes ADD / ADHD in her son; Anxiety disorder in her daughter; Arthritis in her maternal grandmother; CAD in her paternal grandmother; Crohn's disease in her father; Depression in her brother; Diabetes in her maternal  grandfather, mother, and paternal grandmother; Heart disease in her maternal grandmother; High Cholesterol in her mother; Hyperlipidemia in her maternal grandmother; Hypertension in her maternal grandmother and mother; Kidney disease in her maternal grandfather and mother; Mental illness in her brother; Stroke in her paternal grandfather. Social History:  Patient  reports that she has never smoked. She has never used smokeless tobacco. She reports that she does not drink alcohol and does not use drugs.  Review of Systems: Constitutional: Negative for fever malaise or anorexia Cardiovascular: negative for chest pain Respiratory: negative for SOB or persistent cough Gastrointestinal: negative for abdominal pain  Objective  Vitals: BP 118/80 (BP Location: Left Arm, Patient Position: Sitting, Cuff Size: Normal)   Pulse 83   Temp 98.3 F (36.8 C) (Temporal)   Ht 5\' 10"  (1.778 m)   Wt 192 lb 4 oz (87.2 kg)   LMP 11/25/2022 (Exact Date)   SpO2 99%   BMI 27.59 kg/m  General: no acute distress , A&Ox3 Psych: Good insight, tearful, slightly anxious affect.  Commons side effects, risks, benefits, and alternatives for medications and treatment plan prescribed today were discussed, and the patient expressed understanding of the given instructions. Patient is instructed to call or message via MyChart if he/she has any questions or concerns regarding our treatment plan. No barriers to understanding were identified. We discussed Red Flag symptoms and signs in detail. Patient expressed understanding regarding what to do in case of urgent or emergency type symptoms.  Medication list was reconciled, printed and provided to the patient in AVS. Patient instructions and summary information was reviewed with the patient as documented in the AVS. This note was prepared with assistance of Dragon voice recognition software. Occasional wrong-word or sound-a-like substitutions may have occurred due to the inherent  limitations of voice recognition software

## 2022-12-26 ENCOUNTER — Ambulatory Visit: Payer: 59 | Admitting: Gastroenterology

## 2022-12-26 ENCOUNTER — Encounter: Payer: Self-pay | Admitting: Gastroenterology

## 2022-12-26 VITALS — BP 132/76 | HR 94 | Ht 69.0 in | Wt 193.0 lb

## 2022-12-26 DIAGNOSIS — K602 Anal fissure, unspecified: Secondary | ICD-10-CM

## 2022-12-26 HISTORY — DX: Anal fissure, unspecified: K60.2

## 2022-12-26 MED ORDER — AMBULATORY NON FORMULARY MEDICATION: 1 refills | Status: DC
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
## 2022-12-26 NOTE — Patient Instructions (Addendum)
We have sent a prescription for Diltiazem 2% gel with Lidocaine 5% to [facility] for you. Using your index finger, you should apply a small amount of medication inside the rectum up to your first knuckle/joint three times daily x 6-8 weeks.  [facility]'s information is below: Address: [address], [place], [place] [address]  [phone]  *Please DO NOT go directly from our office to pick up this medication! Give the pharmacy 1 day to process the prescription as this is compounded and takes time to make.  Use Hydrocortisone cream nightly for 1 week on Preparation H suppository.   Consider starting Miralax daily 1 capful in 8 ounces of liquid.

## 2022-12-27 NOTE — Progress Notes (Signed)
Addendum: Reviewed and agree with assessment and management plan. Monque Haggar M, MD  

## 2023-01-25 ENCOUNTER — Encounter: Payer: Self-pay | Admitting: Family Medicine

## 2023-01-28 ENCOUNTER — Other Ambulatory Visit: Payer: Self-pay

## 2023-01-28 ENCOUNTER — Other Ambulatory Visit (INDEPENDENT_AMBULATORY_CARE_PROVIDER_SITE_OTHER): Payer: 59

## 2023-01-28 DIAGNOSIS — D500 Iron deficiency anemia secondary to blood loss (chronic): Secondary | ICD-10-CM

## 2023-01-28 LAB — CBC WITH DIFFERENTIAL/PLATELET
Basophils Absolute: 0 10*3/uL (ref 0.0–0.1)
Basophils Relative: 0.5 % (ref 0.0–3.0)
Eosinophils Absolute: 0.1 10*3/uL (ref 0.0–0.7)
Eosinophils Relative: 1.7 % (ref 0.0–5.0)
HCT: 41.5 % (ref 36.0–46.0)
Hemoglobin: 13.2 g/dL (ref 12.0–15.0)
Lymphocytes Relative: 32.3 % (ref 12.0–46.0)
Lymphs Abs: 2.4 10*3/uL (ref 0.7–4.0)
MCHC: 31.9 g/dL (ref 30.0–36.0)
MCV: 92.3 fl (ref 78.0–100.0)
Monocytes Absolute: 0.4 10*3/uL (ref 0.1–1.0)
Monocytes Relative: 6.1 % (ref 3.0–12.0)
Neutro Abs: 4.3 10*3/uL (ref 1.4–7.7)
Neutrophils Relative %: 59.4 % (ref 43.0–77.0)
Platelets: 248 10*3/uL (ref 150.0–400.0)
RBC: 4.5 Mil/uL (ref 3.87–5.11)
RDW: 12.7 % (ref 11.5–15.5)
WBC: 7.3 10*3/uL (ref 4.0–10.5)

## 2023-01-29 ENCOUNTER — Telehealth: Payer: Self-pay | Admitting: Family Medicine

## 2023-01-29 ENCOUNTER — Other Ambulatory Visit: Payer: Self-pay

## 2023-01-29 DIAGNOSIS — D500 Iron deficiency anemia secondary to blood loss (chronic): Secondary | ICD-10-CM

## 2023-01-29 NOTE — Telephone Encounter (Signed)
I have spoken with patient in regard. 

## 2023-01-29 NOTE — Addendum Note (Signed)
Addended by: Trudie Reed A on: 01/29/2023 03:31 PM   Modules accepted: Orders

## 2023-01-29 NOTE — Telephone Encounter (Signed)
Pt called and states the orders for the blood drawn were incorrect and she states she need new orders

## 2023-01-29 NOTE — Addendum Note (Signed)
Addended by: Trudie Reed A on: 01/29/2023 03:28 PM   Modules accepted: Orders

## 2023-01-29 NOTE — Addendum Note (Signed)
Addended by: Trudie Reed A on: 01/29/2023 03:21 PM   Modules accepted: Orders

## 2023-01-29 NOTE — Addendum Note (Signed)
Addended by: Evolett Somarriba A on: 01/29/2023 03:28 PM   Modules accepted: Orders  

## 2023-01-30 ENCOUNTER — Other Ambulatory Visit: Payer: 59

## 2023-01-30 DIAGNOSIS — D500 Iron deficiency anemia secondary to blood loss (chronic): Secondary | ICD-10-CM

## 2023-01-31 LAB — IRON,TIBC AND FERRITIN PANEL
%SAT: 18 % (calc) (ref 16–45)
Ferritin: 11 ng/mL — ABNORMAL LOW (ref 16–232)
Iron: 59 ug/dL (ref 40–190)
TIBC: 335 mcg/dL (calc) (ref 250–450)

## 2023-02-01 ENCOUNTER — Other Ambulatory Visit: Payer: 59

## 2023-02-01 NOTE — Telephone Encounter (Signed)
Patient is calling for update on lab results. States that she received results via mychart. I informed pt that once pcp has reviewed results she would be contacted. Pt verbalized understanding.

## 2023-02-04 NOTE — Telephone Encounter (Signed)
I sent her a note today

## 2023-02-04 NOTE — Progress Notes (Signed)
See my chart note.

## 2023-02-19 ENCOUNTER — Ambulatory Visit: Payer: 59 | Admitting: Family Medicine

## 2023-02-19 ENCOUNTER — Encounter: Payer: Self-pay | Admitting: Family Medicine

## 2023-02-19 VITALS — BP 126/86 | HR 106 | Temp 98.0°F | Ht 69.0 in | Wt 191.8 lb

## 2023-02-19 DIAGNOSIS — K602 Anal fissure, unspecified: Secondary | ICD-10-CM | POA: Diagnosis not present

## 2023-02-19 DIAGNOSIS — F410 Panic disorder [episodic paroxysmal anxiety] without agoraphobia: Secondary | ICD-10-CM | POA: Diagnosis not present

## 2023-02-19 DIAGNOSIS — E611 Iron deficiency: Secondary | ICD-10-CM

## 2023-02-19 DIAGNOSIS — E663 Overweight: Secondary | ICD-10-CM

## 2023-02-19 DIAGNOSIS — Z6828 Body mass index (BMI) 28.0-28.9, adult: Secondary | ICD-10-CM | POA: Diagnosis not present

## 2023-02-19 MED ORDER — PHENTERMINE HCL 37.5 MG PO TABS: 18.7250 mg | ORAL_TABLET | Freq: Every day | ORAL | 5 refills | Status: DC

## 2023-02-19 MED ORDER — BUPROPION HCL ER (XL) 150 MG PO TB24: 150.0000 mg | ORAL_TABLET | Freq: Every day | ORAL | 3 refills | Status: AC

## 2023-02-19 NOTE — Progress Notes (Signed)
Subjective  CC:  Chief Complaint  Patient presents with   Obesity    HPI: Candace [name] is a 47 y.o. female who presents to the office today to address the problems listed above in the chief complaint. Weight loss: down only a few pounds on phentermine which she feels is helpful. Eating clean, exercising, feels stronger but hard to lose. Likely lexapro is contributor to difficulty weight loss.  Panic: c/o weight gain, low libido and orgasmic dysfunction on lexapro; had done well with weight loss prior to lexapro with weight watchers. Does feel it helps control panic. Now seeing a therapist as well which is helpful. Has xanax just in case but has never used it.  Low iron: iron supplementation caused constipation resulting in anal fissure. Finally treated and healed. Regular menses. No recent anemia. Iron stores responded mildly to daily oral iron.   Wt Readings from Last 3 Encounters:  02/19/23 191 lb 12.8 oz (87 kg)  12/26/22 193 lb (87.5 kg)  11/30/22 192 lb 4 oz (87.2 kg)   Lab Results  Component Value Date   WBC 7.3 01/28/2023   HGB 13.2 01/28/2023   HCT 41.5 01/28/2023   MCV 92.3 01/28/2023   PLT 248.0 01/28/2023   Lab Results  Component Value Date   IRON 59 01/30/2023   TIBC 335 01/30/2023   FERRITIN 11 (L) 01/30/2023   Lab Results  Component Value Date   TSH 2.72 08/25/2021     Assessment  1. Overweight (BMI 25.0-29.9)   2. Panic disorder   3. Anal fissure   4. Iron deficiency      Plan  overweight:  agree that SSRI could be making weight loss more difficult. Will wean off lexapro and change to wellbutrin. See AVS. Continue phentermine, healthy diet and exercise Panic disorder: Counseling done.  Elect to change medications as noted above.  Wellbutrin should be a good choice.  Education counseling and appropriate expectations discussed. Anal fissure: Healed.  Discussed prevention of constipation and treatment. Iron deficiency: Recommend high iron diet,  plenty of fiber, can try oral iron supplementation several times a week if she can tolerate it.  MiraLAX if needed.  Stool softeners if needed.  If becomes anemic, can consider Injectafer  Follow up: 8 to 12 weeks to recheck mood and weight 06/11/2023  No orders of the defined types were placed in this encounter.  Meds ordered this encounter  Medications   buPROPion (WELLBUTRIN XL) 150 MG 24 hr tablet    Sig: Take 1 tablet (150 mg total) by mouth daily.    Dispense:  90 tablet    Refill:  3   phentermine (ADIPEX-P) 37.5 MG tablet    Sig: Take 0.5-1 tablets (18.725-37.5 mg total) by mouth daily before breakfast.    Dispense:  30 tablet    Refill:  5      I reviewed the patients updated PMH, FH, and SocHx.    Patient Active Problem List   Diagnosis Date Noted   Oral lichen planus 09/16/2019    Priority: Medium    Subclinical hypothyroidism 09/16/2019    Priority: Medium    Primary localized osteoarthritis of right knee     Priority: Medium    Diverticulosis 09/16/2019    Priority: Low   Anal fissure 12/26/2022   Panic disorder 06/08/2022   Current Meds  Medication Sig   AMBULATORY NON FORMULARY MEDICATION Medication Name: Diltiazem 2% gel with Lidocaine 5% - Using your index finger, you should  apply a small amount of medication inside the rectum up to your first knuckle/joint three times daily x 6-8 weeks.   BIOTIN PO Take 1 each by mouth as needed.   buPROPion (WELLBUTRIN XL) 150 MG 24 hr tablet Take 1 tablet (150 mg total) by mouth daily.   Cholecalciferol (VITAMIN D3) 50 MCG (2000 UT) CAPS Take 1 capsule by mouth daily in the afternoon.   fluocinonide gel (LIDEX) 0.05 % Apply 1 application topically daily as needed (for gum flares).   Multiple Vitamin (MULTIVITAMIN) tablet Take 1 tablet by mouth daily.   [DISCONTINUED] escitalopram (LEXAPRO) 5 MG tablet TAKE 1 TABLET (5 MG TOTAL) BY MOUTH DAILY.   [DISCONTINUED] phentermine (ADIPEX-P) 37.5 MG tablet Take 0.5-1 tablets  (18.725-37.5 mg total) by mouth daily before breakfast.    Allergies: Patient is allergic to lisinopril. Family History: Patient family history includes ADD / ADHD in her son; Anxiety disorder in her daughter; Arthritis in her maternal grandmother; CAD in her paternal grandmother; Crohn's disease in her father; Depression in her brother; Diabetes in her maternal grandfather, mother, and paternal grandmother; Heart disease in her maternal grandmother; High Cholesterol in her mother; Hyperlipidemia in her maternal grandmother; Hypertension in her maternal grandmother and mother; Kidney disease in her maternal grandfather and mother; Mental illness in her brother; Stroke in her paternal grandfather. Social History:  Patient  reports that she has never smoked. She has never used smokeless tobacco. She reports that she does not drink alcohol and does not use drugs.  Review of Systems: Constitutional: Negative for fever malaise or anorexia Cardiovascular: negative for chest pain Respiratory: negative for SOB or persistent cough Gastrointestinal: negative for abdominal pain  Objective  Vitals: BP 126/86   Pulse (!) 106   Temp 98 F (36.7 C)   Ht 5\' 9"  (1.753 m)   Wt 191 lb 12.8 oz (87 kg)   SpO2 98%   BMI 28.32 kg/m  General: no acute distress , A&Ox3 Psych: Normal affect Commons side effects, risks, benefits, and alternatives for medications and treatment plan prescribed today were discussed, and the patient expressed understanding of the given instructions. Patient is instructed to call or message via MyChart if he/she has any questions or concerns regarding our treatment plan. No barriers to understanding were identified. We discussed Red Flag symptoms and signs in detail. Patient expressed understanding regarding what to do in case of urgent or emergency type symptoms.  Medication list was reconciled, printed and provided to the patient in AVS. Patient instructions and summary information  was reviewed with the patient as documented in the AVS. This note was prepared with assistance of Dragon voice recognition software. Occasional wrong-word or sound-a-like substitutions may have occurred due to the inherent limitations of voice recognition software

## 2023-02-19 NOTE — Patient Instructions (Addendum)
Please return in 8 -12 weeks for recheck weight and mood.   Please wean off lexapro as follows: Take 5mg  every other day for a week, Then take 2.5 mg every other day for a week, Then start wellbutrin xl 150mg  daily and lexapro 2.5mg  on M and Th,  Then stop the lexapro.  If you have any questions or concerns, please don't hesitate to send me a message via MyChart or call the office at [phone]. Thank you for visiting with [place] today! It's our pleasure caring for you.

## 2023-03-21 ENCOUNTER — Encounter (INDEPENDENT_AMBULATORY_CARE_PROVIDER_SITE_OTHER): Payer: Self-pay

## 2023-04-23 ENCOUNTER — Ambulatory Visit: Payer: 59 | Admitting: Family Medicine

## 2023-06-06 LAB — CBC AND DIFFERENTIAL
Hemoglobin: 12.9 (ref 12.0–16.0)
Platelets: 210 10*3/uL (ref 150–400)
WBC: 6.3

## 2023-06-06 LAB — IRON,TIBC AND FERRITIN PANEL
%SAT: 17
Ferritin: 12
Iron: 59
TIBC: 344

## 2023-06-06 LAB — HEMOGLOBIN A1C: Hemoglobin A1C: 5.5

## 2023-06-06 LAB — HEPATIC FUNCTION PANEL
ALT: 11 U/L (ref 7–35)
AST: 17 (ref 13–35)
Bilirubin, Total: 0.7

## 2023-06-06 LAB — TSH: TSH: 3.92 (ref 0.41–5.90)

## 2023-06-06 LAB — LIPID PANEL
Cholesterol: 198 (ref 0–200)
HDL: 70 (ref 35–70)
LDL Cholesterol: 113
Triglycerides: 66 (ref 40–160)

## 2023-06-06 LAB — BASIC METABOLIC PANEL
Creatinine: 0.9 (ref 0.5–1.1)
Glucose: 102

## 2023-06-06 LAB — COMPREHENSIVE METABOLIC PANEL: eGFR: 85

## 2023-06-06 LAB — VITAMIN D 25 HYDROXY (VIT D DEFICIENCY, FRACTURES): Vit D, 25-Hydroxy: 32

## 2023-06-11 ENCOUNTER — Encounter: Payer: Self-pay | Admitting: Family Medicine

## 2023-06-11 ENCOUNTER — Ambulatory Visit (INDEPENDENT_AMBULATORY_CARE_PROVIDER_SITE_OTHER): Payer: 59 | Admitting: Family Medicine

## 2023-06-11 VITALS — BP 130/78 | HR 101 | Temp 98.4°F | Ht 69.0 in | Wt 194.4 lb

## 2023-06-11 DIAGNOSIS — Z0001 Encounter for general adult medical examination with abnormal findings: Secondary | ICD-10-CM | POA: Diagnosis not present

## 2023-06-11 DIAGNOSIS — F410 Panic disorder [episodic paroxysmal anxiety] without agoraphobia: Secondary | ICD-10-CM

## 2023-06-11 DIAGNOSIS — E663 Overweight: Secondary | ICD-10-CM

## 2023-06-11 DIAGNOSIS — E038 Other specified hypothyroidism: Secondary | ICD-10-CM

## 2023-06-11 DIAGNOSIS — E611 Iron deficiency: Secondary | ICD-10-CM | POA: Diagnosis not present

## 2023-06-11 NOTE — Progress Notes (Signed)
Subjective  Chief Complaint  Patient presents with   Annual Exam    Pt here for Annual Exam and is not currently fasting. Pt has labs with her and pap she will schedule with gyn   Obesity   Anxiety    HPI: Candace [name] is a 47 y.o. female who presents to [facility] [place] today for a Female Wellness Visit. She also has the concerns and/or needs as listed above in the chief complaint. These will be addressed in addition to the Health Maintenance Visit.   Wellness Visit: annual visit with health maintenance review and exam  HM: sees gyn. Female exam and pap/mammo current and normal. Crc screen current. Imms up to date. Exercising  Chronic disease f/u and/or acute problem visit: (deemed necessary to be done in addition to the wellness visit): Discussed the use of AI scribe software for clinical note transcription with the patient, who gave verbal consent to proceed.  History of Present Illness   The patient, with a history of depression and weight gain, presents for a follow-up visit/cpe. She reports an overall improvement in her emotional health since starting counseling. However, she notes recent familial stressors that have negatively impacted her emotional well-being.  Regarding her medication regimen, the patient has transitioned from Lexapro to Wellbutrin, which she reports has been beneficial. She describes the experience as "barely even know you're taking it," indicating a lack of noticeable side effects and a general sense of well-being.  The patient's primary concern remains her struggle with weight. She expresses frustration with her inability to return to her previous weight, despite dietary changes and exercise. She reports a brief period of weight loss, followed by a regain of the lost weight. The patient has recently resumed taking phentermine, which she had previously discontinued due to perceived ineffectiveness in conjunction with her former  medication, Lexapro. She reports that phentermine makes her feel slightly jittery but also provides a boost of energy and suppresses her appetite.  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 She reports that the job has been a positive change, providing a good balance and a rewarding experience working with children.  The patient also mentions heavy menstrual periods and a history of iron deficiency. She takes iron supplements intermittently due to constipation side effects. Despite these challenges, the patient is determined to manage her weight and improve her overall health.       Assessment  1. Encounter for well adult exam with abnormal findings   2. Subclinical hypothyroidism   3. Panic disorder   4. Iron deficiency   5. Overweight (BMI 25.0-29.9)      Plan  Female Wellness Visit: Age appropriate Health Maintenance and Prevention measures were discussed with patient. Included topics are cancer screening recommendations, ways to keep healthy (see AVS) including dietary and exercise recommendations, regular eye and dental care, use of seat belts, and avoidance of moderate alcohol use and tobacco use.  BMI: discussed patient's BMI and encouraged positive lifestyle modifications to help get to or maintain a target BMI. HM needs and immunizations were addressed and ordered. See below for orders. See HM and immunization section for updates. Routine labs and screening tests ordered including cmp, cbc and lipids where appropriate. Discussed recommendations regarding Vit D and calcium supplementation (see AVS)  Chronic disease management visit and/or acute problem visit: Assessment and Plan  Obesity Struggling with weight management. Currently using phentermine, which is somewhat effective but causes jitteriness. Discussed alternative weight loss medications, including GLP-1 agonists  (Ozempic, Wegovy, Zepbound), which may be more effective but are expensive and may not be covered by insurance. Potential side effects include constipation, diarrhea, and nausea. GLP-1 agonists work on multiple metabolic levels and may provide long-term benefits even after discontinuation. Insurance coverage and cost are significant considerations. - Continue phentermine 37.5mg  daily.  - Check insurance coverage for GLP-1 agonists - Provide information on GLP-1 agonists in aftercare summary - Encourage weight training and regular exercise  Major Depressive Disorder Reports improvement in emotional well-being with current counseling and medication regimen. Wellbutrin has been effective with no significant side effects. Experiencing family stress, which may impact mental health. - Continue Wellbutrin xl 150mg  daily - Continue counseling sessions  Hyperlipidemia Recent lab results show elevated total cholesterol but high HDL levels, which is considered positive. Triglycerides are within normal range. HDL levels contribute to higher total cholesterol but are beneficial. - Continue current management - Encourage healthy diet and exercise  Iron Deficiency Anemia Iron levels are on the lower side of normal. Experiences constipation with iron supplements. Heavy menstrual periods contribute to iron loss. - Continue iron supplementation as tolerated - Consider using Miralax for constipation management  General Health Maintenance Up to date on general health maintenance. Discussed COVID-19 booster options and timing post-infection. - Consider COVID-19 booster in December or thereafter if desired  Follow-up - Send MyChart message if insurance covers GLP-1 agonists - Schedule follow-up visit 8-12 weeks after starting new weight loss medication if applicable - Annual follow-up for general health maintenance unless issues arise.       Follow up: 12 mo cpe  No orders of the defined types were placed  in this encounter.  No orders of the defined types were placed in this encounter.     Body mass index is 28.71 kg/m. Wt Readings from Last 3 Encounters:  06/11/23 194 lb 6.4 oz (88.2 kg)  02/19/23 191 lb 12.8 oz (87 kg)  12/26/22 193 lb (87.5 kg)     Patient Active Problem List   Diagnosis Date Noted Date Diagnosed   Panic disorder 06/08/2022     Priority: High    Initially treated with Lexapro 5 mg daily, good response 2023-2024.  Difficulty with weight loss so changed to Wellbutrin.    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  Internal hemorrhoids     Health Maintenance  Topic Date Due   Cervical Cancer Screening (HPV/Pap Cotest)  08/17/2024   DTaP/Tdap/Td (2 - Td or Tdap) 01/17/2027   Colonoscopy  04/23/2028   INFLUENZA VACCINE  Completed   Hepatitis C Screening  Completed   HIV Screening  Completed   HPV VACCINES  Aged Out   COVID-19 Vaccine  Discontinued   Immunization History  Administered Date(s) Administered   Influenza,inj,Quad PF,6+ Mos 05/06/2020, 06/07/2021, 06/04/2022, 05/31/2023   Influenza-Unspecified 05/27/2019   Moderna Sars-Covid-2 Vaccination 10/16/2019, 11/18/2019   Tdap 01/16/2017   We updated  and reviewed the patient's past history in detail and it is documented below. Allergies: Patient is allergic to lisinopril. Past Medical History Patient  has a past medical history of Anal fissure (12/26/2022), Bunion, left foot (05/29/2018), Diverticulosis (09/16/2019), Hallux valgus (acquired), left foot (05/29/2018), Hypertension, IFG (impaired fasting glucose) (95/62/1308), Oral lichen planus, and  Primary localized osteoarthritis of right knee. Past Surgical History Patient  has a past surgical history that includes Schlerotherapy; Mouth surgery; Bunionectomy; Hammer toe surgery; and Fracture surgery. Family History: Patient family history includes ADD / ADHD in her son; Anxiety disorder in her daughter; Arthritis in her maternal grandmother; CAD in her paternal grandmother; Crohn's disease in her father; Depression in her brother; Diabetes in her maternal grandfather, mother, and paternal grandmother; Heart disease in her maternal grandmother; High Cholesterol in her mother; Hyperlipidemia in her maternal grandmother; Hypertension in her maternal grandmother and mother; Kidney disease in her maternal grandfather and mother; Mental illness in her brother; Stroke in her paternal grandfather. Social History:  Patient  reports that she has never smoked. She has never used smokeless tobacco. She reports that she does not drink alcohol and does not use drugs.  Review of Systems: Constitutional: negative for fever or malaise Ophthalmic: negative for photophobia, double vision or loss of vision Cardiovascular: negative for chest pain, dyspnea on exertion, or new LE swelling Respiratory: negative for SOB or persistent cough Gastrointestinal: negative for abdominal pain, change in bowel habits or melena Genitourinary: negative for dysuria or gross hematuria, no abnormal uterine bleeding or disharge Musculoskeletal: negative for new gait disturbance or muscular weakness Integumentary: negative for new or persistent rashes, no breast lumps Neurological: negative for TIA or stroke symptoms Psychiatric: negative for SI or delusions Allergic/Immunologic: negative for hives  Patient Care Team    Relationship Specialty Notifications Start End  Willow Ora, MD PCP - General Family Medicine  09/16/19   Pyrtle, Carie Caddy, MD Consulting Physician Gastroenterology  09/16/19   Zelphia Cairo, MD Consulting  Physician Obstetrics and Gynecology  09/16/19   [facility]    09/16/19   Blenda Bridegroom, MD Referring Physician Orthopedic Surgery  09/16/19   Consuela Mimes, MD Consulting Physician Family Medicine  09/16/19    Comment: vein specialist    Objective  Vitals: BP 130/78   Pulse (!) 101   Temp 98.4 F (36.9 C)   Ht 5\' 9"  (1.753 m)   Wt 194 lb 6.4 oz (88.2 kg)   SpO2 99%   BMI 28.71 kg/m  General:  Well developed, well nourished, no acute distress  Psych:  Alert and orientedx3,normal mood and affect HEENT:  Normocephalic, atraumatic, non-icteric sclera,  supple neck without adenopathy, mass or thyromegaly Cardiovascular:  Normal S1, S2, RRR without gallop, rub or murmur Respiratory:  Good breath sounds bilaterally, CTAB with normal respiratory effort Gastrointestinal: normal bowel sounds, soft, non-tender, no noted masses. No HSM MSK: extremities without edema, joints without erythema or swelling Neurologic:    Mental status is normal.  Gross motor and sensory exams are normal.  No tremor  Commons side effects, risks, benefits, and alternatives for medications and treatment plan prescribed today were discussed, and the patient expressed understanding of the given instructions. Patient is instructed to call or message via MyChart if he/she has any questions or concerns regarding our treatment plan. No barriers to understanding were identified. We discussed Red Flag symptoms and signs in detail. Patient expressed understanding regarding what to do in case of urgent or emergency type symptoms.  Medication list was reconciled, printed and provided to the patient in AVS. Patient instructions and summary information was reviewed with the patient as documented in the AVS. This note was prepared with assistance of Dragon voice recognition software. Occasional wrong-word or sound-a-like substitutions may have occurred due to the inherent limitations of voice recognition  software

## 2023-06-11 NOTE — Patient Instructions (Signed)
Please return in 12 months for your annual complete physical; please come fasting.   If you have any questions or concerns, please don't hesitate to send me a message via MyChart or call the office at [phone]. Thank you for visiting with [place] today! It's our pleasure caring for you.   VISIT SUMMARY:  During today's visit, we discussed your progress with depression, weight management, and overall health. You reported improvements in your emotional well-being since starting counseling and switching to Wellbutrin. However, you are experiencing some challenges with weight management and family stress. We also reviewed your recent lab results and discussed your iron deficiency and heavy menstrual periods.  YOUR PLAN:  -OBESITY: Obesity means having an excessive amount of body fat, which can impact your health. We discussed continuing phentermine for weight management and exploring alternative medications like GLP-1 agonists, which may be more effective but could be costly and have side effects. Please check with your insurance about coverage for these medications. Continue with weight training and regular exercise. Wegovy or Zepbound  -MAJOR DEPRESSIVE DISORDER: Major Depressive Disorder is a mental health condition characterized by persistent feelings of sadness and loss of interest. You have reported improvement with Wellbutrin and counseling. Continue with your current medication and counseling sessions.  -HYPERLIPIDEMIA: Hyperlipidemia means having high levels of fats (lipids) in your blood, which can increase the risk of heart disease. Your recent labs show elevated total cholesterol but high levels of good cholesterol (HDL). Continue with your current management plan, including a healthy diet and regular exercise.  -IRON DEFICIENCY ANEMIA: Iron Deficiency Anemia is a condition where you lack enough iron, leading to a reduced number of red blood cells. This can cause fatigue and other symptoms.  Continue taking iron supplements as tolerated and consider using Miralax to manage constipation. Your heavy menstrual periods contribute to iron loss.  -GENERAL HEALTH MAINTENANCE: You are up to date on your general health maintenance. We discussed the option of getting a COVID-19 booster in December or later if you wish.  INSTRUCTIONS:  Please send a MyChart message if your insurance covers GLP-1 agonists. Schedule a follow-up visit 8-12 weeks after starting any new weight loss medication if applicable. Otherwise, plan for an annual follow-up for general health maintenance unless other issues arise.

## 2023-07-10 IMAGING — MG MM DIGITAL DIAGNOSTIC UNILAT*L* W/ TOMO W/ CAD
2 series · 3 of 6 positions shown · non-contrast
Comparison: Previous exam(s).

CLINICAL DATA: Recall from screening mammography, possible
asymmetry in the retroareolar LEFT breast at posterior depth visible
only on the MLO view.

EXAM:
DIGITAL DIAGNOSTIC UNILATERAL LEFT MAMMOGRAM WITH TOMOSYNTHESIS AND
CAD
TECHNIQUE: Left digital diagnostic mammography and breast tomosynthesis was
performed. The images were evaluated with computer-aided detection.

[L ML synth-2D]
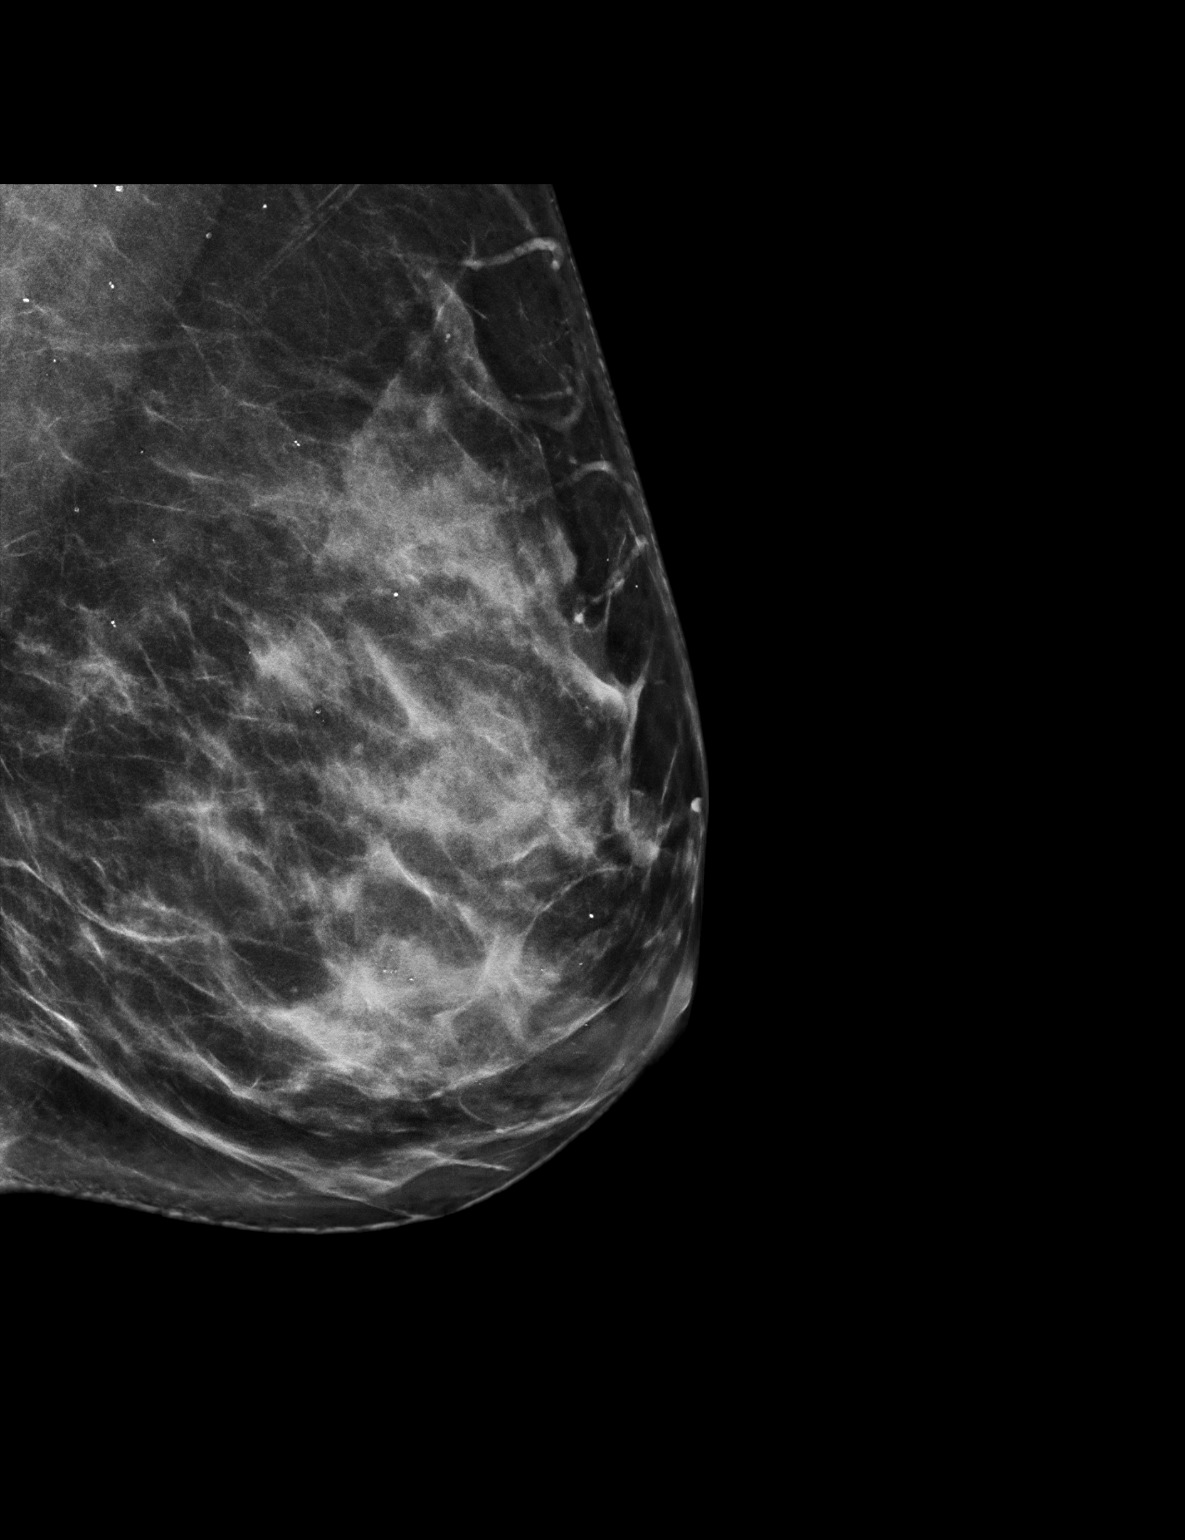

[L MLO tomo · 2 of 76 frames shown]
[frame 25/76]
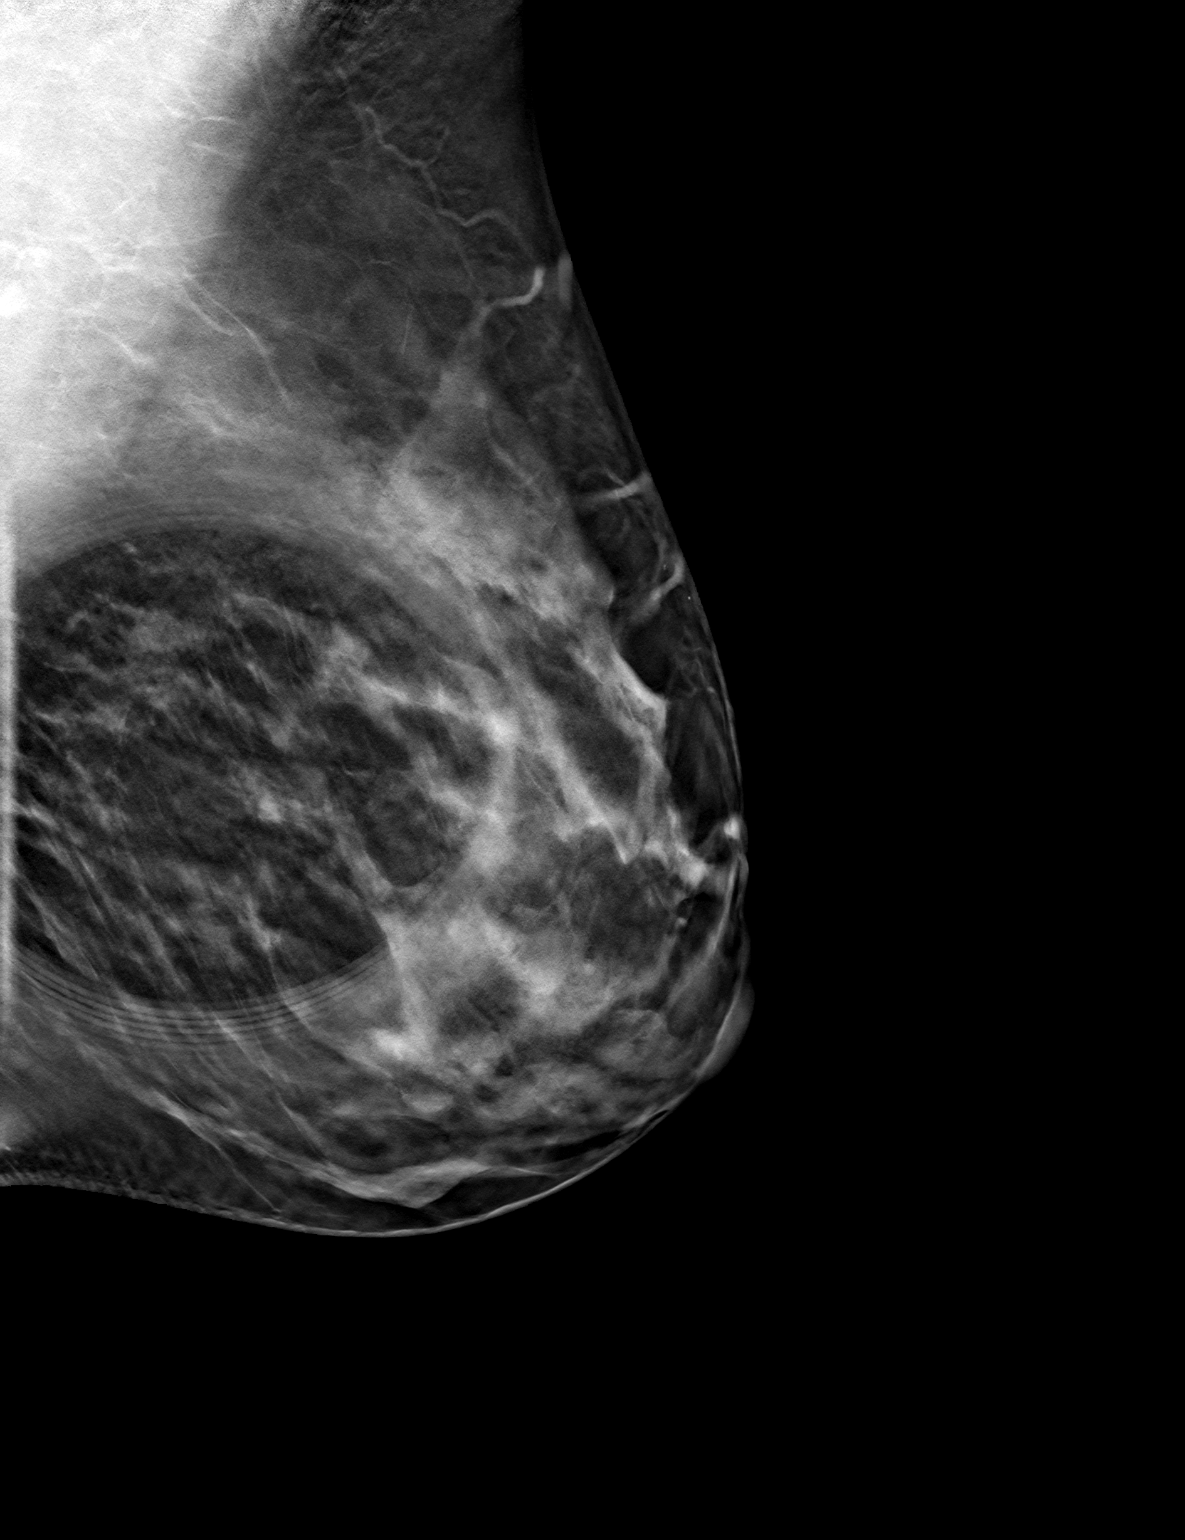
[frame 39/76]
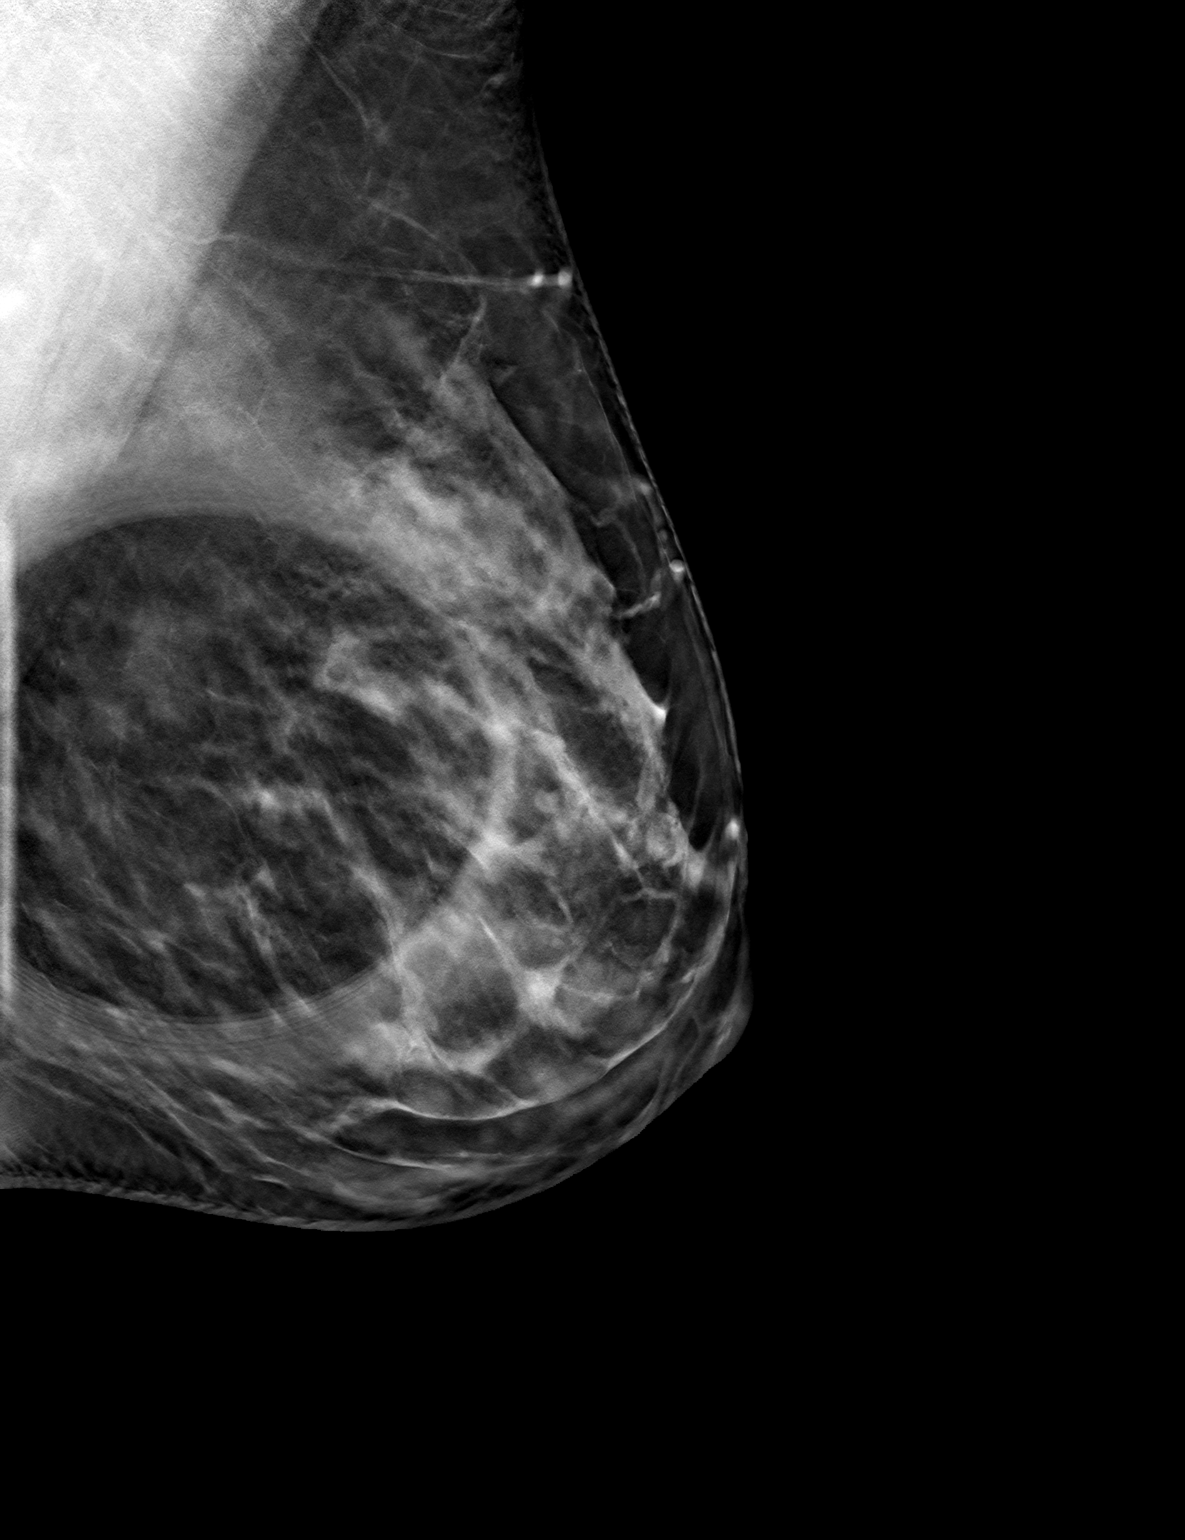

[3 of 6 positions shown; findings below may reference images not displayed]

ACR Breast Density Category c: The breast tissue is heterogeneously
dense, which may obscure small masses.
FINDINGS: Spot-compression MLO view of the area of concern in the full field
mediolateral view were obtained.

The asymmetry in the retroareolar breast at posterior depth
disperses with compression, indicating an island of fibroglandular
tissue. There is no underlying mass or architectural distortion.

No findings suspicious for malignancy.
IMPRESSION: No mammographic evidence of malignancy involving the LEFT breast.

RECOMMENDATION:
Screening mammogram in one year.(Code:VP-1-06L)

I have discussed the findings and recommendations with the patient.
If applicable, a reminder letter will be sent to the patient
regarding the next appointment.

BI-RADS CATEGORY  1: Negative.

## 2023-09-03 ENCOUNTER — Other Ambulatory Visit: Payer: Self-pay | Admitting: Family Medicine

## 2024-04-09 ENCOUNTER — Telehealth: Payer: Self-pay | Admitting: Gastroenterology

## 2024-04-09 NOTE — Telephone Encounter (Signed)
 Pyrtle pt, has not been seen on over a year. She is requesting a refill on diltiazem for fissure. Please advise.  Diltiazem 2% gel with Lidocaine  5% to [facility] for you. Using your index finger, you should apply a small amount of medication inside the rectum up to your first knuckle/joint three times daily x 6-8 weeks.   [facility]'s information is below: Address: [address] [place], [place], [place] [address]  [phone]

## 2024-04-09 NOTE — Telephone Encounter (Signed)
 Refill called to [facility], pt aware.

## 2024-04-09 NOTE — Telephone Encounter (Signed)
 PT is calling to ask for a refill for Ditiazem. She understands she hasn't been here in a year but would still like to see if it is possible to receive.

## 2024-06-02 LAB — LIPID PANEL
Cholesterol: 178 (ref 0–200)
HDL: 59 (ref 35–70)
LDL Cholesterol: 101
LDl/HDL Ratio: 3
Triglycerides: 89 (ref 40–160)

## 2024-06-02 LAB — BASIC METABOLIC PANEL WITH GFR
Creatinine: 0.9 (ref 0.5–1.1)
Glucose: 94

## 2024-06-02 LAB — HEPATIC FUNCTION PANEL
ALT: 7 U/L (ref 7–35)
AST: 15 (ref 13–35)
Alkaline Phosphatase: 80 (ref 25–125)
Bilirubin, Total: 1.1

## 2024-06-02 LAB — HEMOGLOBIN A1C: Hemoglobin A1C: 5.4

## 2024-06-02 LAB — COMPREHENSIVE METABOLIC PANEL WITH GFR
Albumin: 4.3 (ref 3.5–5.0)
Calcium: 8.7 (ref 8.7–10.7)
eGFR: 84

## 2024-06-02 LAB — CBC AND DIFFERENTIAL
Hemoglobin: 12.8 (ref 12.0–16.0)
Platelets: 250 K/uL (ref 150–400)
WBC: 6.5

## 2024-06-02 LAB — TSH: TSH: 2.79 (ref 0.41–5.90)

## 2024-06-02 LAB — VITAMIN D 25 HYDROXY (VIT D DEFICIENCY, FRACTURES): Vit D, 25-Hydroxy: 8.7

## 2024-06-15 ENCOUNTER — Encounter: Payer: 59 | Admitting: Family Medicine

## 2024-06-24 ENCOUNTER — Encounter: Admitting: Family Medicine

## 2024-06-25 ENCOUNTER — Ambulatory Visit: Admitting: Internal Medicine
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
## 2024-07-09 ENCOUNTER — Ambulatory Visit: Admitting: Physician Assistant

## 2024-07-09 ENCOUNTER — Encounter: Payer: Self-pay | Admitting: Physician Assistant

## 2024-07-09 VITALS — BP 120/74 | HR 91 | Ht 69.5 in | Wt 195.0 lb

## 2024-07-09 DIAGNOSIS — K649 Unspecified hemorrhoids: Secondary | ICD-10-CM

## 2024-07-09 DIAGNOSIS — K5909 Other constipation: Secondary | ICD-10-CM

## 2024-07-09 DIAGNOSIS — K5730 Diverticulosis of large intestine without perforation or abscess without bleeding: Secondary | ICD-10-CM

## 2024-07-09 DIAGNOSIS — K625 Hemorrhage of anus and rectum: Secondary | ICD-10-CM

## 2024-07-09 DIAGNOSIS — K648 Other hemorrhoids: Secondary | ICD-10-CM

## 2024-07-09 DIAGNOSIS — K602 Anal fissure, unspecified: Secondary | ICD-10-CM

## 2024-07-09 DIAGNOSIS — Z8379 Family history of other diseases of the digestive system: Secondary | ICD-10-CM

## 2024-07-09 DIAGNOSIS — K5904 Chronic idiopathic constipation: Secondary | ICD-10-CM

## 2024-07-09 MED ORDER — NA SULFATE-K SULFATE-MG SULF 17.5-3.13-1.6 GM/177ML PO SOLN: 1.0000 | Freq: Once | ORAL | 0 refills | Status: AC

## 2024-07-09 MED ORDER — HYDROCORTISONE ACETATE 25 MG RE SUPP: 25.0000 mg | Freq: Every day | RECTAL | 1 refills | Status: AC

## 2024-07-09 MED ORDER — LUBIPROSTONE 8 MCG PO CAPS: 8.0000 ug | ORAL_CAPSULE | Freq: Two times a day (BID) | ORAL | 5 refills | Status: AC

## 2024-07-09 NOTE — Patient Instructions (Addendum)
 We have sent the following medications to your pharmacy for you to pick up at your convenience: Amitiza 8 mcg: Take twice a day Hydrocortisone suppositories: Use 1 suppository rectally at bedtime for 24 days  You have been scheduled for a colonoscopy with Dr. Albertus. Please follow written instructions given to you at your visit today.   If you use inhalers (even only as needed), please bring them with you on the day of your procedure.  DO NOT TAKE 7 DAYS PRIOR TO TEST- Trulicity (dulaglutide) Ozempic, Wegovy (semaglutide) Mounjaro, Zepbound (tirzepatide) Bydureon Bcise (exanatide extended release)  DO NOT TAKE 1 DAY PRIOR TO YOUR TEST Rybelsus (semaglutide) Adlyxin (lixisenatide) Victoza (liraglutide) Byetta (exanatide) ___________________________________________________________________________  Please go to the lab in the basement of our building to pick up the kit to do a fecal calprotectin stool study as you leave today. Hit B for basement when you get on the elevator.  When the doors open the lab is on your left.  We will call you with the results. Thank you.   You have been scheduled for a follow up appointment on Monday, 08-31-24 at 10:30am. Please arrive 10 minutes early for registration. If you need to reschedule or cancel this appointment please call [phone] as soon as possible. Thank you.   Thank you for entrusting me with your care and for choosing [facility], [place], [place]   _______________________________________________________  If your blood pressure at your visit was 140/90 or greater, please contact your primary care physician to follow up on this.  _______________________________________________________  If you are age 46 or older, your body mass index should be between 23-30. Your Body mass index is 28.38 kg/m. If this is out of the aforementioned range listed, please consider follow up with your Primary Care Provider.  If you are age 5 or  younger, your body mass index should be between 19-25. Your Body mass index is 28.38 kg/m. If this is out of the aformentioned range listed, please consider follow up with your Primary Care Provider.   ________________________________________________________  The [name] providers would like to encourage you to use MYCHART to communicate with providers for non-urgent requests or questions.  Due to long hold times on the telephone, sending your provider a message by [facility] may be a faster and more efficient way to get a response.  Please allow 48 business hours for a response.  Please remember that this is for non-urgent requests.  _______________________________________________________  [facility] is using a team-based approach to care.  Your team is made up of your doctor and two to three APPS. Our APPS (Nurse Practitioners and Physician Assistants) work with your physician to ensure care continuity for you. They are fully qualified to address your health concerns and develop a treatment plan. They communicate directly with your gastroenterologist to care for you. Seeing the Advanced Practice Practitioners on your physician's team can help you by facilitating care more promptly, often allowing for earlier appointments, access to diagnostic testing, procedures, and other specialty referrals.

## 2024-07-14 ENCOUNTER — Ambulatory Visit: Admitting: Family Medicine

## 2024-07-16 NOTE — Telephone Encounter (Signed)
 Left message for patient to call back

## 2024-07-17 NOTE — Telephone Encounter (Signed)
 Left message for patient to call back

## 2024-07-17 NOTE — Telephone Encounter (Signed)
 I have spoken to patient to discuss whether she would like us  to add endoscopy to already scheduled colonoscopy. Patient indicates that she has had a low ferritin for years and nobody has seemed concerned. Explained that low ferritin is at times a pre curser to IDA and is indication for evaluation of esophagus, stomach, small bowel and colon to check for bleeding sources or malabsorption issues. Patient agrees to move forward with endoscopy at the same time as colonoscopy. Appointment updated to reflect this in EPIC.

## 2024-07-23 ENCOUNTER — Ambulatory Visit: Admitting: Family Medicine

## 2024-07-23 ENCOUNTER — Encounter: Payer: Self-pay | Admitting: Family Medicine

## 2024-07-23 VITALS — BP 126/92 | HR 89 | Temp 98.1°F | Ht 69.5 in | Wt 194.2 lb

## 2024-07-23 DIAGNOSIS — F4322 Adjustment disorder with anxiety: Secondary | ICD-10-CM

## 2024-07-23 DIAGNOSIS — F410 Panic disorder [episodic paroxysmal anxiety] without agoraphobia: Secondary | ICD-10-CM

## 2024-07-23 DIAGNOSIS — D219 Benign neoplasm of connective and other soft tissue, unspecified: Secondary | ICD-10-CM | POA: Insufficient documentation

## 2024-07-23 DIAGNOSIS — K6289 Other specified diseases of anus and rectum: Secondary | ICD-10-CM

## 2024-07-23 DIAGNOSIS — E663 Overweight: Secondary | ICD-10-CM | POA: Diagnosis not present

## 2024-07-23 MED ORDER — BUSPIRONE HCL 7.5 MG PO TABS: 7.5000 mg | ORAL_TABLET | Freq: Two times a day (BID) | ORAL | 2 refills | Status: DC

## 2024-07-23 NOTE — Patient Instructions (Signed)
 Please follow up as scheduled for your next visit with me: 08/27/2024   If you have any questions or concerns, please don't hesitate to send me a message via MyChart or call the office at [phone]. Thank you for visiting with us  today! It's our pleasure caring for you.    VISIT SUMMARY: During today's visit, we discussed your ongoing stress and anxiety, weight management challenges, and gastrointestinal symptoms. We also reviewed your current medications and made some adjustments to better manage your symptoms.  YOUR PLAN: -ADJUSTMENT ANXIETY: You are experiencing anxiety due to family stressors, which is making you feel on edge and jittery. We have started you on Buspirone  twice daily to help manage your anxiety symptoms. Continue taking Wellbutrin  at your current dose.  -OBESITY: You are having difficulty losing weight despite using Contrave, which has caused some side effects. We discussed the possibility of trying GLP-1 agonists in the future if you are comfortable with it. For now, continue with your current diet and exercise plan.  -CHRONIC CONSTIPATION WITH ANAL FISSURES AND POSSIBLE RECTAL PROLAPSE: You have been experiencing chronic constipation and anal fissures, which are causing pain and discomfort. You are scheduled for a colonoscopy and endoscopy in January to further investigate these issues. We will continue to monitor your symptoms and manage your pain as needed.  INSTRUCTIONS: Please follow up as scheduled for your colonoscopy and endoscopy in January. Continue with your current medications and the new Buspirone  prescription. Maintain your diet and exercise plan, and let us  know if you experience any new or worsening symptoms.                      Contains text generated by Abridge.                                 Contains text generated by Abridge.

## 2024-07-23 NOTE — Progress Notes (Signed)
 Subjective  CC:  Chief Complaint  Patient presents with   Medication Problem    Having some anxiety and depression issues. Wants to see about changing or adding meds. Gets a little gritty as well. Started taking Contrave since her last visit here.    Depression   Anxiety    HPI: Candace CRAGHEAD is a 48 y.o. female who presents to the office today to address the problems listed above in the chief complaint. Discussed the use of AI scribe software for clinical note transcription with the patient, who gave verbal consent to proceed.  History of Present Illness Candace [name] is a 48 year old female who presents with stress and weight management concerns.  Psychological stress and anxiety - Significant stress ongoing for six months, primarily related to family issues involving mother and brother - Frequent crying and feelings of guilt regarding relationship with mother - Moments of anxiety, particularly during summer and fall - Feels 'on edge', jittery, and nervous at work - Symptoms improved slightly after a period of heightened anxiety - No worsening of mood or depression - Currently seeing a counselor for family stressors - had been doing well on wellbutrin  and counseling prior to that; failed lexapro  due to weight gain  Weight management and appetite - Started Contrave at the end of June for weight loss - Lost approximately six to seven pounds since starting Contrave - Currently on highest dose of Contrave (four pills daily, includes 360 mg Wellbutrin ) - Frustration with weight and difficulty losing weight, attributing this to stress - Conscious of diet, but stress perceived as a barrier to further weight loss  Gastrointestinal symptoms - Daily constipation, pain, and anal fissures - Experiences pain and difficulty with bowel movements - Scheduled for colonoscopy and endoscopy in January for further evaluation - Recent labs show low ferritin, with total iron reportedly  within normal limits  Gynecological symptoms - On progesterone-only birth control for management of bleeding due to fibroid - Progesterone-only birth control has been effective in controlling bleeding   Assessment  1. Adjustment disorder with anxiety   2. Panic disorder   3. Overweight (BMI 25.0-29.9)   4. Fibroid   5. Rectal pain      Plan  Assessment and Plan Assessment & Plan Adjustment anxiety Experiencing adjustment anxiety due to significant family stressors, including strained relationships with her mother and brother. Reports feeling on edge and jittery, but remains functional. Current Wellbutrin  dose is adequate for depression, but additional support is needed for anxiety. Buspirone  is considered due to its non-addictive nature and mild side effects, making it suitable for managing situational anxiety. - Initiated Buspirone  twice daily to manage anxiety symptoms. - Continue Wellbutrin  at current dose.  Obesity Struggling with weight loss despite using Contrave, which is costly and not covered by insurance. Reports minimal weight loss and significant side effects from Contrave, including nausea and dizziness. Discussed GLP-1 agonists as a potential option, noting their natural origin and effectiveness in addressing metabolic dysfunction. Concerns about side effects and cost were acknowledged. Emphasized the importance of diet and exercise in conjunction with medication. - Continue current weight management strategies, including diet and exercise. - Will consider GLP-1 agonists if weight loss remains challenging and she is comfortable with the option.  Chronic constipation with anal fissures and possible rectal prolapse Reports chronic constipation, anal fissures, and possible rectal prolapse, causing significant pain and discomfort. Scheduled for a colonoscopy and endoscopy in January to further evaluate the gastrointestinal issues. Low  ferritin levels noted, but total iron is  adequate. - Proceed with scheduled colonoscopy and endoscopy in January. - Continue to monitor gastrointestinal symptoms and manage pain as needed.  Fibroid : continue oral progesterone only contracptive  I personally spent a total of 42 minutes in the care of the patient today including preparing to see the patient, counseling and educating, placing orders, documenting clinical information in the EHR, and independently interpreting results.   Follow up: as scheduled in jan for cpe and f/u No orders of the defined types were placed in this encounter.  Meds ordered this encounter  Medications   busPIRone  (BUSPAR ) 7.5 MG tablet    Sig: Take 1 tablet (7.5 mg total) by mouth 2 (two) times daily.    Dispense:  60 tablet    Refill:  2     I reviewed the patients updated PMH, FH, and SocHx.  Patient Active Problem List   Diagnosis Date Noted   Panic disorder 06/08/2022    Priority: High   Oral lichen planus 09/16/2019    Priority: Medium    Subclinical hypothyroidism 09/16/2019    Priority: Medium    Overweight (BMI 25.0-29.9) 09/16/2019    Priority: Medium    Primary localized osteoarthritis of right knee     Priority: Medium    Iron deficiency 06/11/2023    Priority: Low   Diverticulosis 09/16/2019    Priority: Low   Fibroid 07/23/2024   Active Medications[1] Allergies: Patient is allergic to lisinopril. Family History: Patient family history includes ADD / ADHD in her son; Anxiety disorder in her daughter; Arthritis in her maternal grandmother; CAD in her paternal grandmother; Crohn's disease in her father; Depression in her brother; Diabetes in her maternal grandfather, mother, and paternal grandmother; Heart disease in her maternal grandmother; High Cholesterol in her mother; Hyperlipidemia in her maternal grandmother; Hypertension in her maternal grandmother and mother; Kidney disease in her maternal grandfather and mother; Mental illness in her brother; Stroke in her  paternal grandfather. Social History:  Patient  reports that she has never smoked. She has never used smokeless tobacco. She reports that she does not drink alcohol and does not use drugs.  Review of Systems: Constitutional: Negative for fever malaise or anorexia Cardiovascular: negative for chest pain Respiratory: negative for SOB or persistent cough Gastrointestinal: negative for abdominal pain  Objective  Vitals: BP (!) 126/92 (BP Location: Left Arm, Patient Position: Sitting, Cuff Size: Normal)   Pulse 89   Temp 98.1 F (36.7 C) (Temporal)   Ht 5' 9.5 (1.765 m)   Wt 194 lb 3.2 oz (88.1 kg)   LMP 07/16/2024   SpO2 99%   BMI 28.27 kg/m  General: no acute distress , A&Ox3 Psych: tearful. Good insight. Flat mood Commons side effects, risks, benefits, and alternatives for medications and treatment plan prescribed today were discussed, and the patient expressed understanding of the given instructions. Patient is instructed to call or message via MyChart if he/she has any questions or concerns regarding our treatment plan. No barriers to understanding were identified. We discussed Red Flag symptoms and signs in detail. Patient expressed understanding regarding what to do in case of urgent or emergency type symptoms.  Medication list was reconciled, printed and provided to the patient in AVS. Patient instructions and summary information was reviewed with the patient as documented in the AVS. This note was prepared with assistance of Dragon voice recognition software. Occasional wrong-word or sound-a-like substitutions may have occurred due to the inherent limitations of  voice recognition software    [1]  Current Meds  Medication Sig   BIOTIN PO Take 1 each by mouth as needed.   buPROPion  (WELLBUTRIN  XL) 150 MG 24 hr tablet Take 1 tablet (150 mg total) by mouth daily.   busPIRone  (BUSPAR ) 7.5 MG tablet Take 1 tablet (7.5 mg total) by mouth 2 (two) times daily.   Cholecalciferol (VITAMIN  D3) 50 MCG (2000 UT) CAPS Take 1 capsule by mouth daily in the afternoon.   CONTRAVE 8-90 MG TB12 Take 90 tablets by mouth in the morning and at bedtime.   fluocinonide gel (LIDEX) 0.05 % Apply 1 application topically daily as needed (for gum flares).   hydrocortisone  (ANUSOL -HC) 25 MG suppository Place 1 suppository (25 mg total) rectally at bedtime for 24 days.   lubiprostone  (AMITIZA ) 8 MCG capsule Take 1 capsule (8 mcg total) by mouth 2 (two) times daily with a meal.   Multiple Vitamin (MULTIVITAMIN) tablet Take 1 tablet by mouth daily.   Na Sulfate-K Sulfate-Mg Sulfate concentrate (SUPREP) 17.5-3.13-1.6 GM/177ML SOLN SMARTSIG:1 Kit(s) By Mouth Once   norethindrone (MICRONOR) 0.35 MG tablet Take 1 tablet by mouth daily.   norethindrone (MICRONOR) 0.35 MG tablet Take 1 tablet by mouth daily.   [DISCONTINUED] AMBULATORY NON FORMULARY MEDICATION Medication Name: Diltiazem 2% gel with Lidocaine  5% - Using your index finger, you should apply a small amount of medication inside the rectum up to your first knuckle/joint three times daily x 6-8 weeks.

## 2024-08-14 ENCOUNTER — Other Ambulatory Visit: Payer: Self-pay | Admitting: Family Medicine

## 2024-08-24 ENCOUNTER — Encounter: Payer: Self-pay | Admitting: Internal Medicine

## 2024-08-26 ENCOUNTER — Encounter: Admitting: Internal Medicine

## 2024-08-27 ENCOUNTER — Encounter: Payer: Self-pay | Admitting: Family Medicine

## 2024-08-27 ENCOUNTER — Encounter: Admitting: Family Medicine

## 2024-08-27 VITALS — BP 132/84 | HR 93 | Temp 97.7°F | Ht 69.5 in | Wt 188.2 lb

## 2024-08-27 DIAGNOSIS — Z0001 Encounter for general adult medical examination with abnormal findings: Secondary | ICD-10-CM | POA: Diagnosis not present

## 2024-08-27 DIAGNOSIS — E663 Overweight: Secondary | ICD-10-CM

## 2024-08-27 DIAGNOSIS — F410 Panic disorder [episodic paroxysmal anxiety] without agoraphobia: Secondary | ICD-10-CM

## 2024-08-27 NOTE — Progress Notes (Signed)
 " Subjective  Chief Complaint  Patient presents with   Annual Exam    Pt here for Annual Exam and is not currently fasting     HPI: Candace [name] is a 49 y.o. female who presents to [facility] [place] today for a Female Wellness Visit. She also has the concerns and/or needs as listed above in the chief complaint. These will be addressed in addition to the Health Maintenance Visit.   Wellness Visit: annual visit with health maintenance review and exam  HM: Sees GYN.  Reports mammogram and Pap smears are up-to-date.  She will schedule again in May.  Will call for records.  Schedule a colonoscopy next month.  Workup for lower GI symptoms, constipation etc.  Chronic disease f/u and/or acute problem visit: (deemed necessary to be done in addition to the wellness visit): Discussed the use of AI scribe software for clinical note transcription with the patient, who gave verbal consent to proceed.  History of Present Illness Panic disorder: Continues to work with therapist.  Continues on Wellbutrin , in the form of Contrave.  Mood is stabilizing.  Started BuSpar  about 2 weeks ago.  Not sure if it is effective yet but willing to continue.  Overall reports she is stable. Overweight on Contrave and continues to lose weight in a healthy manner.  Feels good about this.       Assessment  1. Encounter for well adult exam with abnormal findings   2. Panic disorder   3. Overweight (BMI 25.0-29.9)      Plan  Female Wellness Visit: Age appropriate Health Maintenance and Prevention measures were discussed with patient. Included topics are cancer screening recommendations, ways to keep healthy (see AVS) including dietary and exercise recommendations, regular eye and dental care, use of seat belts, and avoidance of moderate alcohol use and tobacco use.  BMI: discussed patient's BMI and encouraged positive lifestyle modifications to help get to or maintain a target BMI. HM needs and  immunizations were addressed and ordered. See below for orders. See HM and immunization section for updates. Routine labs and screening tests ordered including cmp, cbc and lipids where appropriate. Discussed recommendations regarding Vit D and calcium supplementation (see AVS)  Chronic disease management visit and/or acute problem visit: Assessment and Plan Assessment & Plan Panic disorder Experiencing anxiety and panic symptoms, exacerbated by interpersonal stressors. Recently started Buspar , which is well-tolerated without significant side effects. Wellbutrin  is continued through Contrave. Buspar  is non-addictive, does not cause sedation, and is safe to take with Wellbutrin . - Continue Wellbutrin  through Contrave. - Continue Buspar  twice daily. - Monitor anxiety symptoms and effectiveness of Buspar .  Overweight Lost six pounds in the last month, indicating progress in weight management. - Continue current weight management efforts.  Chronic constipation Undergoing evaluation for chronic constipation and hemorrhoids. Colonoscopy scheduled for March to investigate potential causes, including microscopic bleeding. Endoscopy not recommended due to lack of upper GI symptoms. - Proceed with scheduled colonoscopy in March. - Discuss with GI specialist regarding the necessity of endoscopy.     Follow up: 12 mo for cpe  No orders of the defined types were placed in this encounter.  No orders of the defined types were placed in this encounter.     Body mass index is 27.39 kg/m. Wt Readings from Last 3 Encounters:  08/27/24 188 lb 3.2 oz (85.4 kg)  07/23/24 194 lb 3.2 oz (88.1 kg)  07/09/24 195 lb (88.5 kg)     Patient  Active Problem List   Diagnosis Date Noted Date Diagnosed   Panic disorder 06/08/2022     Priority: High    Initially treated with Lexapro  5 mg daily, good response 2023-2024.  Difficulty with weight loss so changed to Wellbutrin .    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  Internal hemorrhoids     Fibroid 07/23/2024    Health Maintenance  Topic Date Due   Mammogram  Never done   Cervical Cancer Screening (HPV/Pap Cotest)  08/17/2024   Hepatitis B Vaccines 19-59 Average Risk (1 of 3 - 19+ 3-dose series) 07/23/2025 (Originally 10/11/1996)   DTaP/Tdap/Td (2 - Td or Tdap) 01/17/2027   Colonoscopy  04/23/2028   Influenza Vaccine  Completed   HPV VACCINES (No Doses Required) Completed   Hepatitis C Screening  Completed   HIV Screening  Completed   Pneumococcal Vaccine  Aged Out   Meningococcal B Vaccine  Aged Out   COVID-19 Vaccine  Discontinued   Immunization History  Administered Date(s) Administered   Influenza, Seasonal, Injecte, Preservative Fre 06/07/2024   Influenza,inj,Quad PF,6+ Mos 05/06/2020, 06/07/2021, 06/04/2022, 05/31/2023   Influenza,trivalent, recombinat, inj, PF 05/30/2023   Influenza-Unspecified 05/27/2019   Moderna Sars-Covid-2 Vaccination 10/16/2019, 11/18/2019, 06/10/2020   Pfizer(Comirnaty)Fall Seasonal Vaccine 12 years and older 08/05/2022   Tdap 01/16/2017   We updated and reviewed the patient's past history in detail and it is documented below. Allergies: Patient is allergic to lisinopril. Past Medical History Patient  has a past medical history of Anal fissure (12/26/2022), Bunion, left foot (05/29/2018), Diverticulosis (09/16/2019), Hallux valgus (acquired), left foot (05/29/2018), Hypertension, IFG (impaired fasting glucose) (97/89/7978), Oral lichen planus, and Primary localized osteoarthritis of right knee. Past Surgical History Patient  has a past surgical  history that includes Schlerotherapy; Mouth surgery; Bunionectomy; Hammer toe surgery; and Fracture surgery. Family History: Patient family history includes ADD / ADHD in her son; Anxiety disorder in her daughter; Arthritis in her maternal grandmother; CAD in her paternal grandmother; Crohn's disease in her father; Depression in her brother; Diabetes in her maternal grandfather, mother, and paternal grandmother; Heart disease in her maternal grandmother; High Cholesterol in her mother; Hyperlipidemia in her maternal grandmother; Hypertension in her maternal grandmother and mother; Kidney disease in her maternal grandfather and mother; Mental illness in her brother; Stroke in her paternal grandfather. Social History:  Patient  reports that she has never smoked. She has never used smokeless tobacco. She reports that she does not drink alcohol and does not use drugs.  Review of Systems: Constitutional: negative for fever or malaise Ophthalmic: negative for photophobia, double vision or loss of vision Cardiovascular: negative for chest pain, dyspnea on exertion, or new LE swelling Respiratory: negative for SOB or persistent cough Gastrointestinal: negative for abdominal pain, change in bowel habits or melena Genitourinary: negative for dysuria or gross hematuria, no abnormal uterine bleeding or disharge Musculoskeletal: negative for new gait disturbance or muscular weakness Integumentary: negative for new or persistent rashes, no breast lumps Neurological: negative for TIA or stroke symptoms Psychiatric: negative for SI  or delusions Allergic/Immunologic: negative for hives  Patient Care Team    Relationship Specialty Notifications Start End  Jodie Lavern CROME, MD PCP - General Family Medicine  09/16/19   Pyrtle, Gordy HERO, MD Consulting Physician Gastroenterology  09/16/19   Latisha Medford, MD Consulting Physician Obstetrics and Gynecology  09/16/19   [facility]    09/16/19    Jadine Sieving, MD Referring Physician Orthopedic Surgery  09/16/19   Patty Anes, MD Consulting Physician Family Medicine  09/16/19    Comment: vein specialist    Objective  Vitals: BP 132/84   Pulse 93   Temp 97.7 F (36.5 C)   Ht 5' 9.5 (1.765 m)   Wt 188 lb 3.2 oz (85.4 kg)   SpO2 98%   BMI 27.39 kg/m  General:  Well developed, well nourished, no acute distress  Psych:  Alert and orientedx3,normal mood and affect HEENT:  Normocephalic, atraumatic, non-icteric sclera,  supple neck without adenopathy, mass or thyromegaly Cardiovascular:  Normal S1, S2, RRR without gallop, rub or murmur Respiratory:  Good breath sounds bilaterally, CTAB with normal respiratory effort Gastrointestinal: normal bowel sounds, soft, non-tender, no noted masses. No HSM MSK: extremities without edema, joints without erythema or swelling Neurologic:    Mental status is normal.  Gross motor and sensory exams are normal.  No tremor  No visits with results within 1 Day(s) from this visit.  Latest known visit with results is:  Office Visit on 07/23/2024  Component Date Value Ref Range Status   Hemoglobin 06/02/2024 12.8  12.0 - 16.0 Final   Platelets 06/02/2024 250  150 - 400 K/uL Final   WBC 06/02/2024 6.5   Final   Vit D, 25-Hydroxy 06/02/2024 8.7   Final   Glucose 06/02/2024 94   Final   Creatinine 06/02/2024 0.9  0.5 - 1.1 Final   eGFR 06/02/2024 84   Final   Calcium 06/02/2024 8.7  8.7 - 10.7 Final   Albumin 06/02/2024 4.3  3.5 - 5.0 Final   LDl/HDL Ratio 06/02/2024 3.0   Final   Triglycerides 06/02/2024 89  40 - 160 Final   Cholesterol 06/02/2024 178  0 - 200 Final   HDL 06/02/2024 59  35 - 70 Final   LDL Cholesterol 06/02/2024 101   Final   Alkaline Phosphatase 06/02/2024 80  25 - 125 Final   ALT 06/02/2024 7  7 - 35 U/L Final   AST 06/02/2024 15  13 - 35 Final   Bilirubin, Total 06/02/2024 1.1   Final   Hemoglobin A1C 06/02/2024 5.4   Final   TSH 06/02/2024 2.79  0.41 - 5.90  Final    Commons side effects, risks, benefits, and alternatives for medications and treatment plan prescribed today were discussed, and the patient expressed understanding of the given instructions. Patient is instructed to call or message via MyChart if he/she has any questions or concerns regarding our treatment plan. No barriers to understanding were identified. We discussed Red Flag symptoms and signs in detail. Patient expressed understanding regarding what to do in case of urgent or emergency type symptoms.  Medication list was reconciled, printed and provided to the patient in AVS. Patient instructions and summary information was reviewed with the patient as documented in the AVS. This note was prepared with assistance of Dragon voice recognition software. Occasional wrong-word or sound-a-like substitutions may have occurred due to the inherent limitations of voice recognition software     "

## 2024-08-27 NOTE — Patient Instructions (Signed)
Please return in 12 months for your annual complete physical; please come fasting.   If you have any questions or concerns, please don't hesitate to send me a message via MyChart or call the office at [phone]. Thank you for visiting with us today! It's our pleasure caring for you.  Please do these things to maintain good health!   Exercise at least 30-45 minutes a day,  4-5 days a week.   Eat a low-fat diet with lots of fruits and vegetables, up to 7-9 servings per day.  Drink plenty of water daily. Try to drink 8 8oz glasses per day.  Seatbelts can save your life. Always wear your seatbelt.  Place Smoke Detectors on every level of your home and check batteries every year.  Schedule an appointment with an eye doctor for an eye exam every 1-2 years  Safe sex - use condoms to protect yourself from STDs if you could be exposed to these types of infections. Use birth control if you do not want to become pregnant and are sexually active.  Avoid heavy alcohol use. If you drink, keep it to less than 2 drinks/day and not every day.  Health Care Power of Attorney.  Choose someone you trust that could speak for you if you became unable to speak for yourself.  Depression is common in our stressful world.If you're feeling down or losing interest in things you normally enjoy, please come in for a visit.  If anyone is threatening or hurting you, please get help. Physical or Emotional Violence is never OK.   

## 2024-08-31 ENCOUNTER — Ambulatory Visit: Admitting: Physician Assistant

## 2024-08-31 ENCOUNTER — Encounter: Admitting: Internal Medicine

## 2024-09-11 ENCOUNTER — Ambulatory Visit: Admitting: Physician Assistant

## 2024-09-28 ENCOUNTER — Encounter: Admitting: Internal Medicine

## 2024-10-23 ENCOUNTER — Encounter: Admitting: Internal Medicine

## 2024-10-29 ENCOUNTER — Ambulatory Visit: Admitting: Physician Assistant

## 2025-09-02 ENCOUNTER — Encounter: Admitting: Family Medicine
